# Patient Record
Sex: Female | Born: 1965 | Race: Black or African American | Hispanic: No | Smoking: Never smoker
Health system: Southern US, Community
[De-identification: ages and names within clinical notes are randomized; demographics above are authoritative.]

## PROBLEM LIST (undated history)

## (undated) DIAGNOSIS — J45909 Unspecified asthma, uncomplicated: Secondary | ICD-10-CM

## (undated) DIAGNOSIS — K819 Cholecystitis, unspecified: Secondary | ICD-10-CM

## (undated) DIAGNOSIS — M810 Age-related osteoporosis without current pathological fracture: Secondary | ICD-10-CM

## (undated) DIAGNOSIS — R768 Other specified abnormal immunological findings in serum: Secondary | ICD-10-CM

## (undated) DIAGNOSIS — M069 Rheumatoid arthritis, unspecified: Secondary | ICD-10-CM

## (undated) DIAGNOSIS — M1990 Unspecified osteoarthritis, unspecified site: Secondary | ICD-10-CM

## (undated) DIAGNOSIS — R7689 Other specified abnormal immunological findings in serum: Secondary | ICD-10-CM

## (undated) HISTORY — PX: REPLACEMENT TOTAL KNEE: SUR1224

---

## 1990-12-04 DIAGNOSIS — Z87410 Personal history of cervical dysplasia: Secondary | ICD-10-CM

## 1990-12-04 HISTORY — PX: CERVICAL BIOPSY  W/ LOOP ELECTRODE EXCISION: SUR135

## 1990-12-04 HISTORY — DX: Personal history of cervical dysplasia: Z87.410

## 2016-12-04 HISTORY — PX: TOTAL KNEE ARTHROPLASTY: SHX125

## 2019-01-30 ENCOUNTER — Other Ambulatory Visit: Payer: Self-pay | Admitting: Family Medicine

## 2019-01-30 DIAGNOSIS — Z1231 Encounter for screening mammogram for malignant neoplasm of breast: Secondary | ICD-10-CM

## 2019-02-26 ENCOUNTER — Ambulatory Visit: Payer: Self-pay

## 2019-03-26 ENCOUNTER — Ambulatory Visit: Payer: Self-pay

## 2019-05-27 ENCOUNTER — Ambulatory Visit
Admission: RE | Admit: 2019-05-27 | Discharge: 2019-05-27 | Disposition: A | Discharge status: Home / Self Care | Payer: 59 | Source: Ambulatory Visit | Attending: Family Medicine | Admitting: Family Medicine

## 2019-05-27 ENCOUNTER — Other Ambulatory Visit: Payer: Self-pay

## 2019-05-27 DIAGNOSIS — Z1231 Encounter for screening mammogram for malignant neoplasm of breast: Secondary | ICD-10-CM

## 2019-08-12 ENCOUNTER — Emergency Department (HOSPITAL_COMMUNITY)
Admission: EM | Admit: 2019-08-12 | Discharge: 2019-08-12 | Disposition: A | Discharge status: Home / Self Care | Payer: 59 | Attending: Emergency Medicine | Admitting: Emergency Medicine

## 2019-08-12 ENCOUNTER — Encounter (HOSPITAL_COMMUNITY): Payer: Self-pay | Admitting: Emergency Medicine

## 2019-08-12 DIAGNOSIS — H18892 Other specified disorders of cornea, left eye: Secondary | ICD-10-CM | POA: Insufficient documentation

## 2019-08-12 DIAGNOSIS — H5712 Ocular pain, left eye: Secondary | ICD-10-CM | POA: Diagnosis present

## 2019-08-12 DIAGNOSIS — M069 Rheumatoid arthritis, unspecified: Secondary | ICD-10-CM | POA: Insufficient documentation

## 2019-08-12 HISTORY — DX: Rheumatoid arthritis, unspecified: M06.9

## 2019-08-12 MED ORDER — TETRACAINE HCL 0.5 % OP SOLN
2.0000 [drp] | Freq: Once | OPHTHALMIC | Status: AC
  Administered 2019-08-12: 2 [drp] via OPHTHALMIC
  Filled 2019-08-12: qty 4

## 2019-08-12 MED ORDER — FLUORESCEIN SODIUM 1 MG OP STRP
1.0000 | ORAL_STRIP | Freq: Once | OPHTHALMIC | Status: AC
  Administered 2019-08-12: 1 via OPHTHALMIC
  Filled 2019-08-12: qty 1

## 2019-08-12 NOTE — Discharge Instructions (Signed)
Please make sure to fill your prescription for the eyedrops that you received while at urgent care yesterday.  Please call your ophthalmologist today to schedule an appointment for follow-up.  Return to the emergency department immediately for any vision changes, red eye, persistent eye pain, persistent headache, numbness/weakness, dizziness/lightheadedness, or any new or worsening symptoms.

## 2019-08-12 NOTE — ED Provider Notes (Signed)
[facility] EMERGENCY DEPARTMENT Provider Note   CSN: [id number] Arrival date & time: 08/12/19  1000     History   Chief Complaint Chief Complaint  Patient presents with  . Eye Pain    HPI Donna Mason is a 53 y.o. female.     HPI   Patient is a 53 year old female with history of RA who presents emergency department today for evaluation of left eye irritation.  States that 6 days ago she was getting her into her car when she walked by someone who was working on the line and there was a lot of dust in the air.  States she had some irritation to her eye there and was rubbing her eye however did not have any significant pain.  That night she woke up with left eye pain.  States the pain shot to the back of her head.  Symptoms resolved several hours later after taking Tylenol and some prednisone that she has for her RA.  Since then she has had intermittent pain in the eye described as foreign body sensation.  She denies any visual changes.  She has had some clear tearing present. Has had intermittent photophobia that has since resolved. Denies any floaters/flashes, persistent headaches.  No dizziness, lightheadedness, vomiting, numbness/weakness.  She had had some nasal congestion.  She does not wear contacts.  States she has a prescription for glasses but has not worn them in several years.  She is never had similar symptoms.  States he was seen at urgent care and had fluorescein stain completed which did not show any uptake.  She was given Rx for Polytrim which she states she has been unable to fill yet because the pharmacy was closed.  Past Medical History:  Diagnosis Date  . RA (rheumatoid arthritis) [facility])     Patient Active Problem List   Diagnosis Date Noted  . RA (rheumatoid arthritis) ([place])     History reviewed. No pertinent surgical history.   OB History   No obstetric history on file.      Home Medications    Prior to Admission medications    Not on File    Family History History reviewed. No pertinent family history.  Social History Social History   Tobacco Use  . Smoking status: Never Smoker  . Smokeless tobacco: Never Used  Substance Use Topics  . Alcohol use: Not on file  . Drug use: Not on file     Allergies   Patient has no known allergies.   Review of Systems Review of Systems  Constitutional: Negative for fever.  HENT: Positive for congestion.   Eyes: Positive for photophobia (resolved), pain (intermittent, resolved) and discharge (clear). Negative for redness and visual disturbance.  Respiratory: Negative for shortness of breath.   Cardiovascular: Negative for chest pain.  Gastrointestinal: Negative for abdominal pain.  Genitourinary: Negative for pelvic pain.  Neurological: Positive for headaches (resolved). Negative for dizziness, weakness, light-headedness and numbness.     Physical Exam Updated Vital Signs BP (!) 135/101   Pulse 83   Temp 99.8 F (37.7 C) (Oral)   Resp 18   SpO2 100%   Physical Exam Vitals signs and nursing note reviewed.  Constitutional:      General: She is not in acute distress.    Appearance: She is well-developed.  HENT:     Head: Normocephalic and atraumatic.  Eyes:     General:        Right eye: No discharge.  Left eye: No discharge.     Extraocular Movements: Extraocular movements intact.     Conjunctiva/sclera: Conjunctivae normal.     Pupils: Pupils are equal, round, and reactive to light.     Comments: Fluorescein stain completed and without evidence of uptake.  Tonometry completed, 18 mmHg to both right/left eyes.  No photophobia or consensual photophobia.  Neck:     Musculoskeletal: Normal range of motion and neck supple.  Cardiovascular:     Rate and Rhythm: Normal rate.  Pulmonary:     Effort: Pulmonary effort is normal.  Musculoskeletal: Normal range of motion.     Comments: TTP to the left cervical paraspinous muscles  Skin:     General: Skin is warm and dry.  Neurological:     Mental Status: She is alert.     Comments: Mental Status:  Alert, thought content appropriate, able to give a coherent history. Speech fluent without evidence of aphasia. Able to follow 2 step commands without difficulty.  Cranial Nerves:  II:  pupils equal, round, reactive to light III,IV, VI: ptosis not present, extra-ocular motions intact bilaterally  V,VII: smile symmetric, facial light touch sensation equal VIII: hearing grossly normal to voice  X: uvula elevates symmetrically  XI: bilateral shoulder shrug symmetric and strong XII: midline tongue extension without fassiculations Motor:  Normal tone. 5/5 strength of BUE and BLE major muscle groups including strong and equal grip strength and dorsiflexion/plantar flexion Sensory: light touch normal in all extremities.      ED Treatments / Results  Labs (all labs ordered are listed, but only abnormal results are displayed) Labs Reviewed - No data to display  EKG None  Radiology No results found.  Procedures Procedures (including critical care time)  Medications Ordered in ED Medications  tetracaine (PONTOCAINE) 0.5 % ophthalmic solution 2 drop (2 drops Left Eye Given 08/12/19 1213)  fluorescein ophthalmic strip 1 strip (1 strip Left Eye Given 08/12/19 1213)     Initial Impression / Assessment and Plan / ED Course  I have reviewed the triage vital signs and the nursing notes.  Pertinent labs & imaging results that were available during my care of the patient were reviewed by me and considered in my medical decision making (see chart for details).     Final Clinical Impressions(s) / ED Diagnoses   Final diagnoses:  Corneal irritation of left eye   52 year old female complaining of left eye irritation/clear drainage intermittently for the last 5 days.  On exam, pupils are equal round and reactive to light.  Extraocular motions are intact. Fluorescein stain completed and  without evidence of uptake.  Tonometry completed, 18 mmHg to both right/left eyes.  No photophobia or consensual photophobia.  Visual acuity completed and patient has 20/40 vision in the left eye, 20/50 in the right eye and 20/50 bilaterally without corrective glasses (patient did not bring these and does not wear them).  Her vision is actually better in the left eye which is the eye of concern.  Exam non-concerning for orbital cellulitis, hyphema, corneal ulcers or trauma.  Doubt angle-closure glaucoma, CVA, or other emergent ophthalmologic or neurologic cause of symptoms.  She does have an ophthalmologist.  I recommended she contact her ophthalmologist for an appointment this week.  I also recommended filling her Rx for Polytrim.  I have advised her to return to the ED for any new or worsening symptoms.  She voiced understanding of the plan and reasons to return.  All questions answered.  Patient stable  for discharge.  ED Discharge Orders    None       Karrie MeresCouture, Zavior Thomason S, [place]PA-C 08/12/19 1413    Tegeler, Canary Brimhristopher J, MD 08/12/19 [phone]

## 2019-08-12 NOTE — ED Notes (Signed)
Patient verbalizes understanding of discharge instructions. Opportunity for questioning and answers were provided. Armband removed by staff, pt discharged from ED ambulatory to home.  

## 2019-08-12 NOTE — ED Triage Notes (Signed)
Pt here from home with c/p left eye pain a drainage , pt had her eye looked at was told that it was not pink eye and no tear did not test pressure

## 2019-08-21 ENCOUNTER — Other Ambulatory Visit: Payer: Self-pay | Admitting: Family Medicine

## 2019-08-21 DIAGNOSIS — R42 Dizziness and giddiness: Secondary | ICD-10-CM

## 2019-08-21 DIAGNOSIS — H539 Unspecified visual disturbance: Secondary | ICD-10-CM

## 2019-09-10 ENCOUNTER — Other Ambulatory Visit: Payer: 59

## 2020-02-17 ENCOUNTER — Other Ambulatory Visit: Payer: Self-pay | Admitting: Family Medicine

## 2020-02-17 DIAGNOSIS — Z1231 Encounter for screening mammogram for malignant neoplasm of breast: Secondary | ICD-10-CM

## 2020-03-04 ENCOUNTER — Ambulatory Visit: Payer: 59 | Attending: Internal Medicine

## 2020-03-04 DIAGNOSIS — Z23 Encounter for immunization: Secondary | ICD-10-CM

## 2020-03-04 NOTE — Progress Notes (Signed)
   Covid-19 Vaccination Clinic  Name:  Donna Mason    MRN: [id number] DOB: October 22, 1966  03/04/2020  [name] was observed post Covid-19 immunization for 15 minutes without incident. She was provided with Vaccine Information Sheet and instruction to access the V-Safe system.   [name] was instructed to call 911 with any severe reactions post vaccine: Marland Kitchen Difficulty breathing  . Swelling of face and throat  . A fast heartbeat  . A bad rash all over body  . Dizziness and weakness   Immunizations Administered    Name Date Dose VIS Date Route   Pfizer COVID-19 Vaccine 03/04/2020  3:30 PM 0.3 mL 11/14/2019 Intramuscular   Manufacturer: [employer], [employer]   Lot: [id number]   NDC: [address]-3

## 2020-03-29 ENCOUNTER — Ambulatory Visit: Payer: 59 | Attending: Internal Medicine

## 2020-03-29 DIAGNOSIS — Z23 Encounter for immunization: Secondary | ICD-10-CM

## 2020-03-29 NOTE — Progress Notes (Signed)
   Covid-19 Vaccination Clinic  Name:  Harveen Flesch    MRN: [id number] DOB: 04-12-1966  03/29/2020  Ms. Hamada was observed post Covid-19 immunization for 15 minutes without incident. She was provided with Vaccine Information Sheet and instruction to access the V-Safe system.   Ms. Pua was instructed to call 911 with any severe reactions post vaccine: Marland Kitchen Difficulty breathing  . Swelling of face and throat  . A fast heartbeat  . A bad rash all over body  . Dizziness and weakness   Immunizations Administered    Name Date Dose VIS Date Route   Pfizer COVID-19 Vaccine 03/29/2020 12:08 PM 0.3 mL 01/28/2019 Intramuscular   Manufacturer: [employer], [employer]   Lot: [id number]   NDC: [address]-5

## 2020-05-27 ENCOUNTER — Ambulatory Visit
Admission: RE | Admit: 2020-05-27 | Discharge: 2020-05-27 | Disposition: A | Discharge status: Home / Self Care | Payer: 59 | Source: Ambulatory Visit | Attending: Family Medicine | Admitting: Family Medicine

## 2020-05-27 ENCOUNTER — Other Ambulatory Visit: Payer: Self-pay

## 2020-05-27 DIAGNOSIS — Z1231 Encounter for screening mammogram for malignant neoplasm of breast: Secondary | ICD-10-CM

## 2020-10-16 ENCOUNTER — Ambulatory Visit: Payer: 59

## 2020-12-31 DIAGNOSIS — Z8616 Personal history of COVID-19: Secondary | ICD-10-CM

## 2020-12-31 HISTORY — DX: Personal history of COVID-19: Z86.16

## 2021-04-05 ENCOUNTER — Other Ambulatory Visit: Payer: Self-pay | Admitting: Family Medicine

## 2021-04-05 DIAGNOSIS — Z1231 Encounter for screening mammogram for malignant neoplasm of breast: Secondary | ICD-10-CM

## 2021-05-30 ENCOUNTER — Other Ambulatory Visit: Payer: Self-pay

## 2021-05-30 ENCOUNTER — Ambulatory Visit
Admission: RE | Admit: 2021-05-30 | Discharge: 2021-05-30 | Disposition: A | Discharge status: Home / Self Care | Payer: 59 | Source: Ambulatory Visit | Attending: Family Medicine | Admitting: Family Medicine

## 2021-05-30 DIAGNOSIS — Z1231 Encounter for screening mammogram for malignant neoplasm of breast: Secondary | ICD-10-CM

## 2021-06-01 ENCOUNTER — Other Ambulatory Visit: Payer: Self-pay | Admitting: Family Medicine

## 2021-06-01 DIAGNOSIS — R928 Other abnormal and inconclusive findings on diagnostic imaging of breast: Secondary | ICD-10-CM

## 2021-06-15 ENCOUNTER — Ambulatory Visit
Admission: RE | Admit: 2021-06-15 | Discharge: 2021-06-15 | Disposition: A | Discharge status: Home / Self Care | Payer: 59 | Source: Ambulatory Visit | Attending: Family Medicine | Admitting: Family Medicine

## 2021-06-15 ENCOUNTER — Other Ambulatory Visit: Payer: Self-pay

## 2021-06-15 DIAGNOSIS — R928 Other abnormal and inconclusive findings on diagnostic imaging of breast: Secondary | ICD-10-CM

## 2021-06-19 ENCOUNTER — Emergency Department (HOSPITAL_BASED_OUTPATIENT_CLINIC_OR_DEPARTMENT_OTHER)
Admission: EM | Admit: 2021-06-19 | Discharge: 2021-06-19 | Disposition: A | Discharge status: Home / Self Care | Payer: 59 | Attending: Emergency Medicine | Admitting: Emergency Medicine

## 2021-06-19 ENCOUNTER — Other Ambulatory Visit: Payer: Self-pay

## 2021-06-19 ENCOUNTER — Encounter (HOSPITAL_BASED_OUTPATIENT_CLINIC_OR_DEPARTMENT_OTHER): Payer: Self-pay

## 2021-06-19 DIAGNOSIS — Z96651 Presence of right artificial knee joint: Secondary | ICD-10-CM | POA: Insufficient documentation

## 2021-06-19 DIAGNOSIS — H539 Unspecified visual disturbance: Secondary | ICD-10-CM | POA: Insufficient documentation

## 2021-06-19 MED ORDER — TETRACAINE HCL 0.5 % OP SOLN
2.0000 [drp] | Freq: Once | OPHTHALMIC | Status: AC
  Administered 2021-06-19: 2 [drp] via OPHTHALMIC
  Filled 2021-06-19: qty 4

## 2021-06-19 MED ORDER — FLUORESCEIN SODIUM 1 MG OP STRP
ORAL_STRIP | OPHTHALMIC | Status: AC
  Administered 2021-06-19: 1
  Filled 2021-06-19: qty 1

## 2021-06-19 NOTE — Discharge Instructions (Addendum)
You were seen in the emergency department for left eye visual disturbance.  We reviewed her case with Dr. Dione Booze.  Please call him at [phone] to arrange follow-up with him today.

## 2021-06-19 NOTE — ED Notes (Signed)
Pt dc home with understanding of dc instructions and followup care.

## 2021-06-19 NOTE — ED Triage Notes (Signed)
Sneezing and coughing for three days "allergies"  Onset last night of seeing a "circle" outline in left eye.  Denies pain but feels like have something in eye.

## 2021-06-19 NOTE — ED Provider Notes (Signed)
[facility] EMERGENCY DEPT Provider Note   CSN: [id number] Arrival date & time: 06/19/21  1046     History Chief Complaint  Patient presents with  . Eye Problem    Seeing circle in vision    Donna Mason is a 55 y.o. female.  She said she had a foreign body sensation in her left eye since yesterday.  Since then she has noticed a circular defect that seems to be moving in and out of her visual field.  She said with the lights off she can see a pulsing.  She does not feel her vision is diminished.  Supposed to wear glasses but does not because it gives her headaches.  Few days of some allergy symptoms.  The history is provided by the patient.  Eye Problem Location:  Left eye Quality:  Foreign body sensation Onset quality:  Sudden Duration:  2 days Timing:  Intermittent Progression:  Unchanged Chronicity:  New Relieved by:  Nothing Worsened by:  Nothing Ineffective treatments:  Eye drops Associated symptoms: no blurred vision, no decreased vision, no double vision, no headaches and no photophobia       Past Medical History:  Diagnosis Date  . RA (rheumatoid arthritis) [facility])     Patient Active Problem List   Diagnosis Date Noted  . RA (rheumatoid arthritis) ([facility])     Past Surgical History:  Procedure Laterality Date  . REPLACEMENT TOTAL KNEE Right      OB History   No obstetric history on file.     Family History  Problem Relation Age of Onset  . Breast cancer Neg Hx     Social History   Tobacco Use  . Smoking status: Never  . Smokeless tobacco: Never  Vaping Use  . Vaping Use: Never used  Substance Use Topics  . Alcohol use: Not Currently  . Drug use: Not Currently    Home Medications Prior to Admission medications   Not on File    Allergies    Patient has no known allergies.  Review of Systems   Review of Systems  Constitutional:  Negative for fever.  HENT:  Negative for sore throat.   Eyes:  Positive for visual  disturbance. Negative for blurred vision, double vision and photophobia.  Respiratory:  Negative for shortness of breath.   Cardiovascular:  Negative for chest pain.  Gastrointestinal:  Negative for abdominal pain.  Genitourinary:  Negative for dysuria.  Skin:  Negative for rash.  Neurological:  Negative for headaches.   Physical Exam Updated Vital Signs BP (!) 148/94 (BP Location: Right Arm)   Pulse 82   Temp 98.7 F (37.1 C) (Oral)   Resp 16   Ht 5\' 3"  (1.6 m)   Wt 82.6 kg   SpO2 100%   BMI 32.24 kg/m   Physical Exam Constitutional:      Appearance: Normal appearance. She is well-developed.  HENT:     Head: Normocephalic and atraumatic.  Eyes:     General: Lids are everted, no foreign bodies appreciated. Vision grossly intact. Gaze aligned appropriately.        Right eye: No foreign body.        Left eye: No foreign body.     Intraocular pressure: Right eye pressure is 18 mmHg. Left eye pressure is 18 mmHg. Measurements were taken using a handheld tonometer.    Extraocular Movements: Extraocular movements intact.     Conjunctiva/sclera: Conjunctivae normal.     Right eye: Right conjunctiva is  not injected.     Left eye: Left conjunctiva is not injected.     Pupils: Pupils are equal, round, and reactive to light.     Left eye: No fluorescein uptake.     Funduscopic exam:    Right eye: No exudate.        Left eye: No exudate.     Slit lamp exam:    Right eye: Anterior chamber quiet.     Left eye: Anterior chamber quiet.  Musculoskeletal:     Cervical back: Neck supple.  Skin:    General: Skin is warm and dry.  Neurological:     General: No focal deficit present.     Mental Status: She is alert and oriented to person, place, and time.     GCS: GCS eye subscore is 4. GCS verbal subscore is 5. GCS motor subscore is 6.     Cranial Nerves: No cranial nerve deficit.     Sensory: No sensory deficit.     Motor: No weakness.    ED Results / Procedures / Treatments    Labs (all labs ordered are listed, but only abnormal results are displayed) Labs Reviewed - No data to display  EKG None  Radiology No results found.  Procedures Procedures   Medications Ordered in ED Medications  fluorescein 1 MG ophthalmic strip (has no administration in time range)  tetracaine (PONTOCAINE) 0.5 % ophthalmic solution 2 drop (has no administration in time range)    ED Course  I have reviewed the triage vital signs and the nursing notes.  Pertinent labs & imaging results that were available during my care of the patient were reviewed by me and considered in my medical decision making (see chart for details).  Clinical Course as of 06/19/21 1713  Sun Jun 19, 2021  1116 Discussed with Dr. Dione Booze, Ophtho.  He is recommending that she be seen either today or tomorrow for more complete exam. [MB]    Clinical Course User Index [MB] Terrilee Files, MD   MDM Rules/Calculators/A&P                         55 year old female with 2 days of left eye vision symptoms.  Unclear if she is describing floaters or flashes.  No clear abnormalities on my exam.  Reviewed case with ophthalmology who recommends her being seen in his office today.  Patient in agreement with plan.  Final Clinical Impression(s) / ED Diagnoses Final diagnoses:  Visual disturbance    Rx / DC Orders ED Discharge Orders     None        Terrilee Files, MD 06/19/21 [phone]

## 2021-07-08 ENCOUNTER — Other Ambulatory Visit: Payer: Self-pay

## 2021-07-08 ENCOUNTER — Emergency Department (HOSPITAL_BASED_OUTPATIENT_CLINIC_OR_DEPARTMENT_OTHER)
Admission: EM | Admit: 2021-07-08 | Discharge: 2021-07-08 | Disposition: A | Discharge status: Home / Self Care | Payer: 59 | Attending: Emergency Medicine | Admitting: Emergency Medicine

## 2021-07-08 ENCOUNTER — Emergency Department (HOSPITAL_BASED_OUTPATIENT_CLINIC_OR_DEPARTMENT_OTHER): Payer: 59

## 2021-07-08 ENCOUNTER — Encounter (HOSPITAL_BASED_OUTPATIENT_CLINIC_OR_DEPARTMENT_OTHER): Payer: Self-pay | Admitting: *Deleted

## 2021-07-08 ENCOUNTER — Emergency Department (HOSPITAL_BASED_OUTPATIENT_CLINIC_OR_DEPARTMENT_OTHER): Payer: 59 | Admitting: Radiology

## 2021-07-08 DIAGNOSIS — M546 Pain in thoracic spine: Secondary | ICD-10-CM | POA: Insufficient documentation

## 2021-07-08 DIAGNOSIS — K8020 Calculus of gallbladder without cholecystitis without obstruction: Secondary | ICD-10-CM

## 2021-07-08 DIAGNOSIS — J45909 Unspecified asthma, uncomplicated: Secondary | ICD-10-CM | POA: Insufficient documentation

## 2021-07-08 DIAGNOSIS — R1011 Right upper quadrant pain: Secondary | ICD-10-CM | POA: Diagnosis not present

## 2021-07-08 DIAGNOSIS — Z96651 Presence of right artificial knee joint: Secondary | ICD-10-CM | POA: Diagnosis not present

## 2021-07-08 HISTORY — DX: Unspecified asthma, uncomplicated: J45.909

## 2021-07-08 LAB — URINALYSIS, ROUTINE W REFLEX MICROSCOPIC
Bilirubin Urine: NEGATIVE
Glucose, UA: NEGATIVE mg/dL
Hgb urine dipstick: NEGATIVE
Ketones, ur: NEGATIVE mg/dL
Leukocytes,Ua: NEGATIVE
Nitrite: NEGATIVE
Protein, ur: NEGATIVE mg/dL
Specific Gravity, Urine: 1.011 (ref 1.005–1.030)
pH: 6.5 (ref 5.0–8.0)

## 2021-07-08 LAB — CBC WITH DIFFERENTIAL/PLATELET
Abs Immature Granulocytes: 0.02 10*3/uL (ref 0.00–0.07)
Basophils Absolute: 0 10*3/uL (ref 0.0–0.1)
Basophils Relative: 0 %
Eosinophils Absolute: 0.1 10*3/uL (ref 0.0–0.5)
Eosinophils Relative: 1 %
HCT: 34.6 % — ABNORMAL LOW (ref 36.0–46.0)
Hemoglobin: 11.1 g/dL — ABNORMAL LOW (ref 12.0–15.0)
Immature Granulocytes: 0 %
Lymphocytes Relative: 37 %
Lymphs Abs: 2.2 10*3/uL (ref 0.7–4.0)
MCH: 27.3 pg (ref 26.0–34.0)
MCHC: 32.1 g/dL (ref 30.0–36.0)
MCV: 85 fL (ref 80.0–100.0)
Monocytes Absolute: 0.5 10*3/uL (ref 0.1–1.0)
Monocytes Relative: 8 %
Neutro Abs: 3.2 10*3/uL (ref 1.7–7.7)
Neutrophils Relative %: 54 %
Platelets: 192 10*3/uL (ref 150–400)
RBC: 4.07 MIL/uL (ref 3.87–5.11)
RDW: 14.7 % (ref 11.5–15.5)
WBC: 5.9 10*3/uL (ref 4.0–10.5)
nRBC: 0 % (ref 0.0–0.2)

## 2021-07-08 LAB — COMPREHENSIVE METABOLIC PANEL
ALT: 13 U/L (ref 0–44)
AST: 14 U/L — ABNORMAL LOW (ref 15–41)
Albumin: 3.5 g/dL (ref 3.5–5.0)
Alkaline Phosphatase: 76 U/L (ref 38–126)
Anion gap: 8 (ref 5–15)
BUN: 8 mg/dL (ref 6–20)
CO2: 28 mmol/L (ref 22–32)
Calcium: 8.4 mg/dL — ABNORMAL LOW (ref 8.9–10.3)
Chloride: 104 mmol/L (ref 98–111)
Creatinine, Ser: 0.68 mg/dL (ref 0.44–1.00)
GFR, Estimated: >60 mL/min (ref >60)
Glucose, Bld: 80 mg/dL (ref 70–99)
Potassium: 4.1 mmol/L (ref 3.5–5.1)
Sodium: 140 mmol/L (ref 135–145)
Total Bilirubin: 0.8 mg/dL (ref 0.3–1.2)
Total Protein: 6.4 g/dL — ABNORMAL LOW (ref 6.5–8.1)

## 2021-07-08 LAB — PREGNANCY, URINE: Preg Test, Ur: NEGATIVE

## 2021-07-08 MED ORDER — HYDROCODONE-ACETAMINOPHEN 5-325 MG PO TABS: 1.0000 | ORAL_TABLET | Freq: Four times a day (QID) | ORAL | 0 refills | Status: DC | PRN

## 2021-07-08 NOTE — ED Provider Notes (Signed)
Patient's labs are within normal limits, urine is within normal limits.  Chest x-ray within normal limits.  Ultrasound shows approximately 6 mm polyp versus adherent stone at the gallbladder fundus.  Patient reports that the pain seems to be worse after eating and is a dull ache but then will get sharp pains all in the right upper quadrant.  Suspect this is most likely cholelithiasis.  No evidence of cholecystitis at this time.  Findings discussed with the patient.  She was given prescription for pain medication to use as needed and follow-up with general surgery.   Gwyneth Sprout, MD 07/08/21 [phone]

## 2021-07-08 NOTE — ED Provider Notes (Signed)
[facility] EMERGENCY DEPT Provider Note   CSN: [id number] Arrival date & time: 07/08/21  1048     History No chief complaint on file.   Donna Mason is a 55 y.o. female.  HPI  55 year old female with past medical history of RA presents the emergency department with right upper back pain that wraps around to her right upper quadrant.  She states it feels like she has something swollen up underneath her rib cage.  She is never had symptoms like this before.  Denies any fever, nausea/vomiting/diarrhea, shortness of breath, leg swelling, cough.  She is had no prior surgeries in the abdomen.  Denies any genitourinary symptoms.  Past Medical History:  Diagnosis Date   Asthma    RA (rheumatoid arthritis) ([facility])     Patient Active Problem List   Diagnosis Date Noted   RA (rheumatoid arthritis) ([facility])     Past Surgical History:  Procedure Laterality Date   REPLACEMENT TOTAL KNEE Right      OB History   No obstetric history on file.     Family History  Problem Relation Age of Onset   Breast cancer Neg Hx     Social History   Tobacco Use   Smoking status: Never   Smokeless tobacco: Never  Vaping Use   Vaping Use: Never used  Substance Use Topics   Alcohol use: Not Currently   Drug use: Not Currently    Home Medications Prior to Admission medications   Not on File    Allergies    Patient has no known allergies.  Review of Systems   Review of Systems  Constitutional:  Negative for chills and fever.  HENT:  Negative for congestion.   Eyes:  Negative for visual disturbance.  Respiratory:  Negative for shortness of breath.   Cardiovascular:  Negative for chest pain.  Gastrointestinal:  Positive for abdominal pain. Negative for diarrhea and vomiting.  Genitourinary:  Negative for difficulty urinating, dysuria and flank pain.  Musculoskeletal:  Positive for back pain.  Skin:  Negative for rash.  Neurological:  Negative for headaches.    Physical Exam Updated Vital Signs BP 120/64   Pulse (!) 52   Temp 98.5 F (36.9 C) (Oral)   Resp 17   Ht 5\' 3"  (1.6 m)   Wt 90.3 kg   SpO2 99%   BMI 35.25 kg/m   Physical Exam  ED Results / Procedures / Treatments   Labs (all labs ordered are listed, but only abnormal results are displayed) Labs Reviewed  CBC WITH DIFFERENTIAL/PLATELET  COMPREHENSIVE METABOLIC PANEL  URINALYSIS, ROUTINE W REFLEX MICROSCOPIC  PREGNANCY, URINE    EKG None  Radiology No results found.  Procedures Procedures   Medications Ordered in ED Medications - No data to display  ED Course  I have reviewed the triage vital signs and the nursing notes.  Pertinent labs & imaging results that were available during my care of the patient were reviewed by me and considered in my medical decision making (see chart for details).    MDM Rules/Calculators/A&P                           55 year old female presents the emergency department with right upper back pain that wraps around to her right upper quadrant.  Abdomen is soft and benign.  Vitals are normal and stable.  Concern for possible gallbladder/lung pathology.  No associated shortness of breath, leg swelling.  Lower  suspicion for PE at this time given her presentation and normal vitals, she is Wells 0 in terms of risk.  Plan for metabolic work-up, ultrasound of the right upper quadrant and chest x-ray.   Final Clinical Impression(s) / ED Diagnoses Final diagnoses:  None    Rx / DC Orders ED Discharge Orders     None        Rozelle Logan, DO 07/08/21 1435

## 2021-07-08 NOTE — ED Triage Notes (Addendum)
Tuesday developed mid back pain radiates to rt under breast. Denies N/V/D. Standing seems to help a little. States she wheezes when she lies down.

## 2021-07-08 NOTE — Discharge Instructions (Addendum)
Ultrasound showed that you most likely have a gallstone today.  All your labs otherwise look normal.  Try to avoid fatty, fried or oily foods.  Also raw vegetables can sometimes make the pain worse.  If Tylenol works that is fine but you were given a prescription for stronger pain medicine if you need.  If the pain becomes severe you start vomiting or run a fever you should return to the emergency room.

## 2021-10-25 IMAGING — MG MM DIGITAL SCREENING BILAT W/ TOMO AND CAD
6 of 10 series · 6 of 30 positions shown · non-contrast
Comparison: Previous exam(s).

CLINICAL DATA: Screening.

EXAM:
DIGITAL SCREENING BILATERAL MAMMOGRAM WITH TOMOSYNTHESIS AND CAD
TECHNIQUE: Bilateral screening digital craniocaudal and mediolateral oblique
mammograms were obtained. Bilateral screening digital breast
tomosynthesis was performed. The images were evaluated with
computer-aided detection.

[L CC synth-2D (1 of 2)]
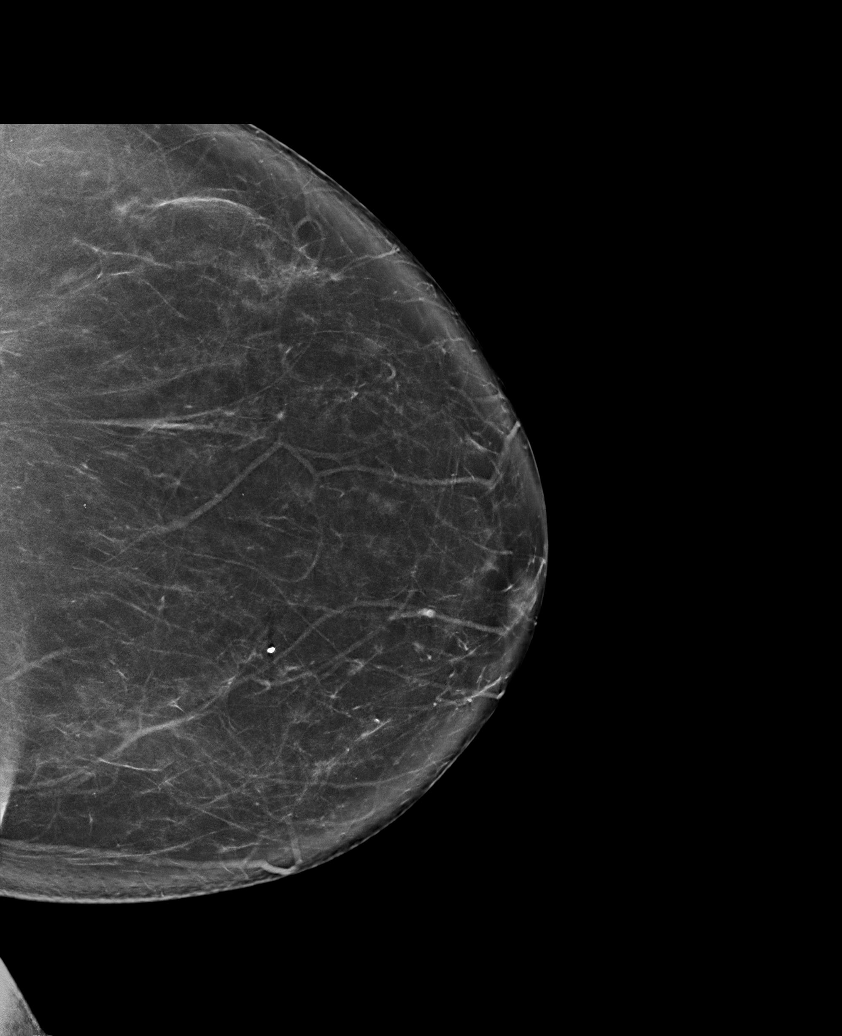

[L CC synth-2D (2 of 2)]
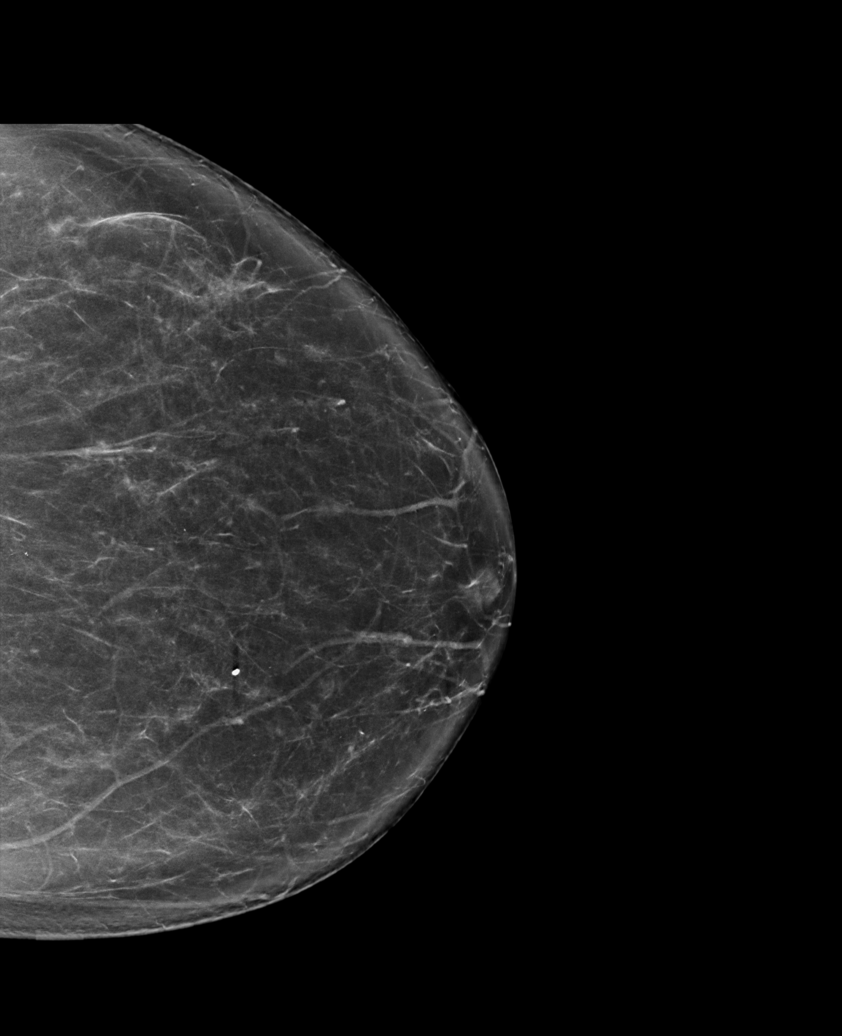

[R CC synth-2D]
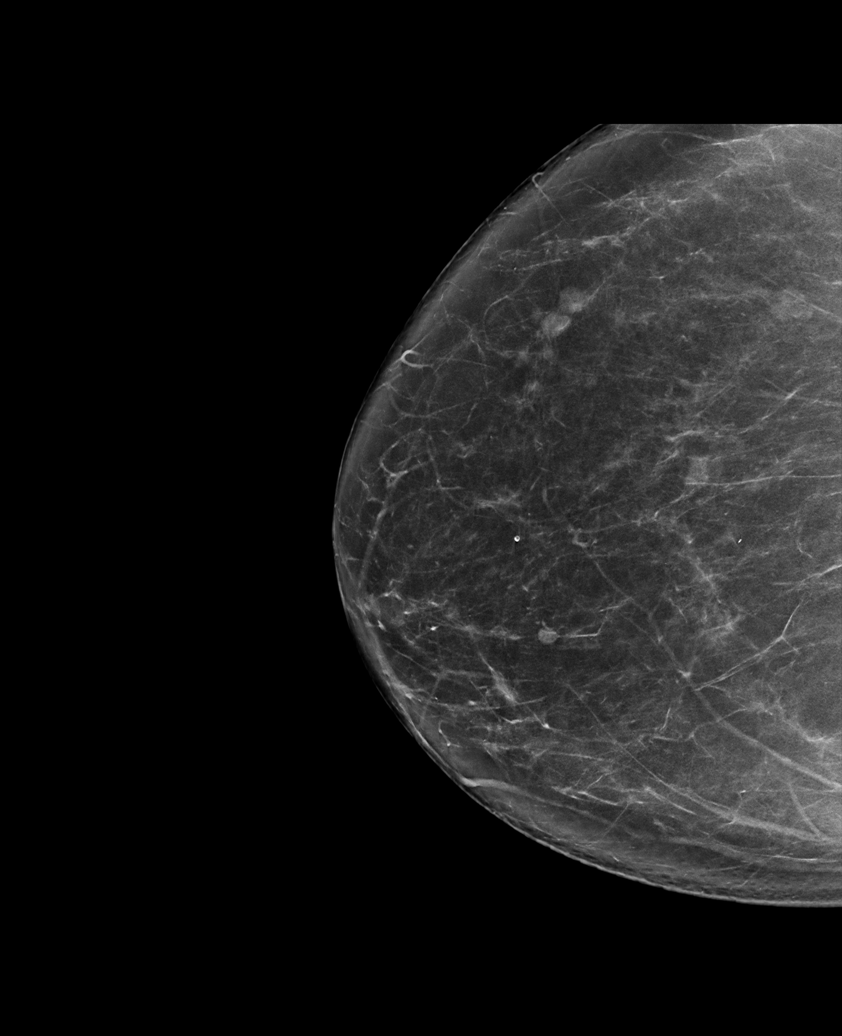

[L MLO synth-2D]
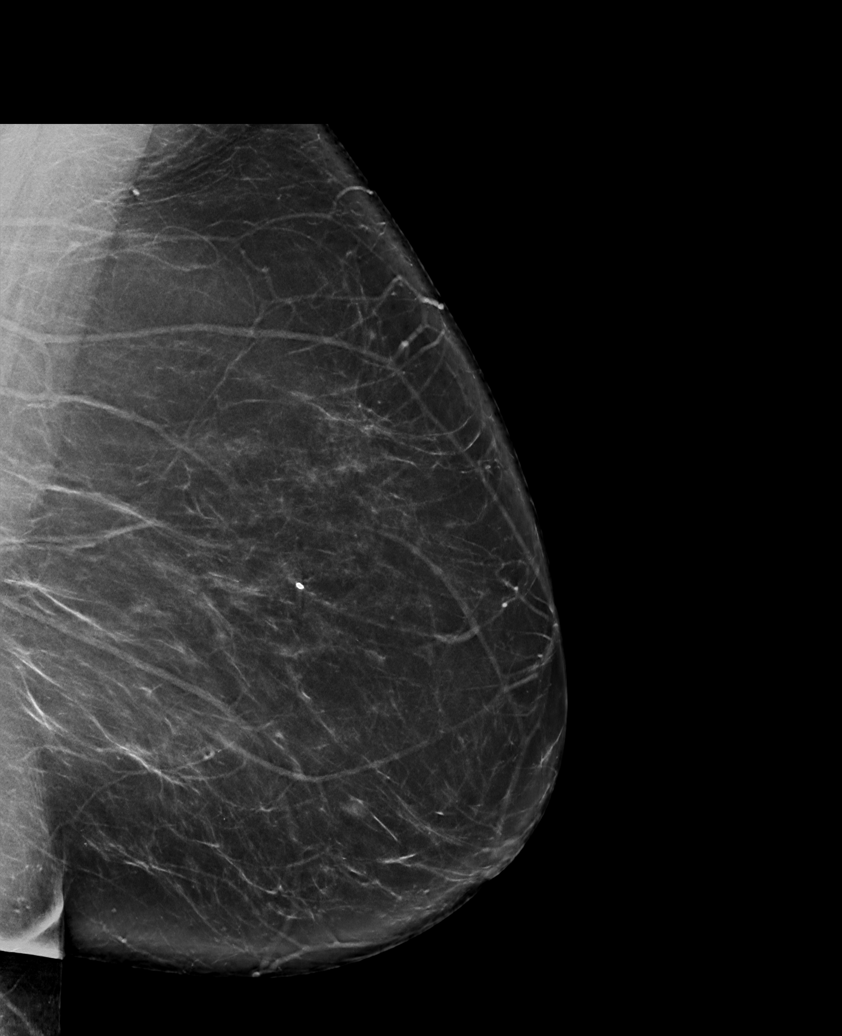

[R MLO synth-2D]
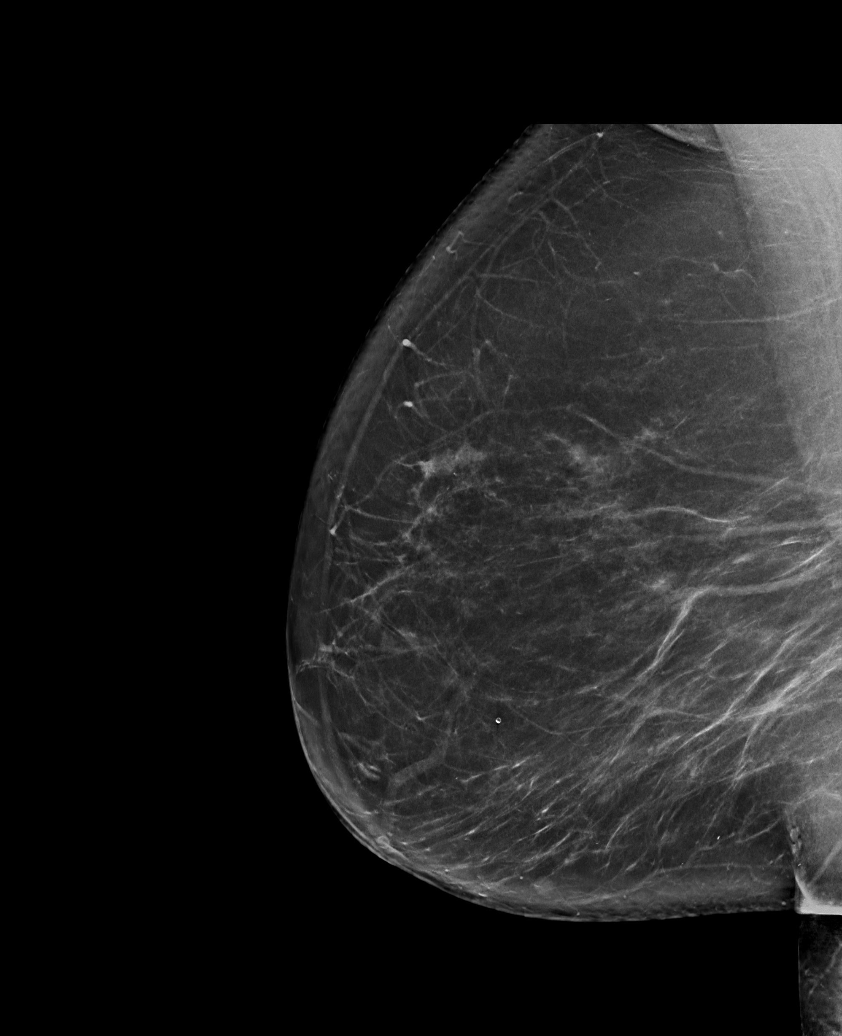

[L CC tomo · tomo slice 44/87.0]
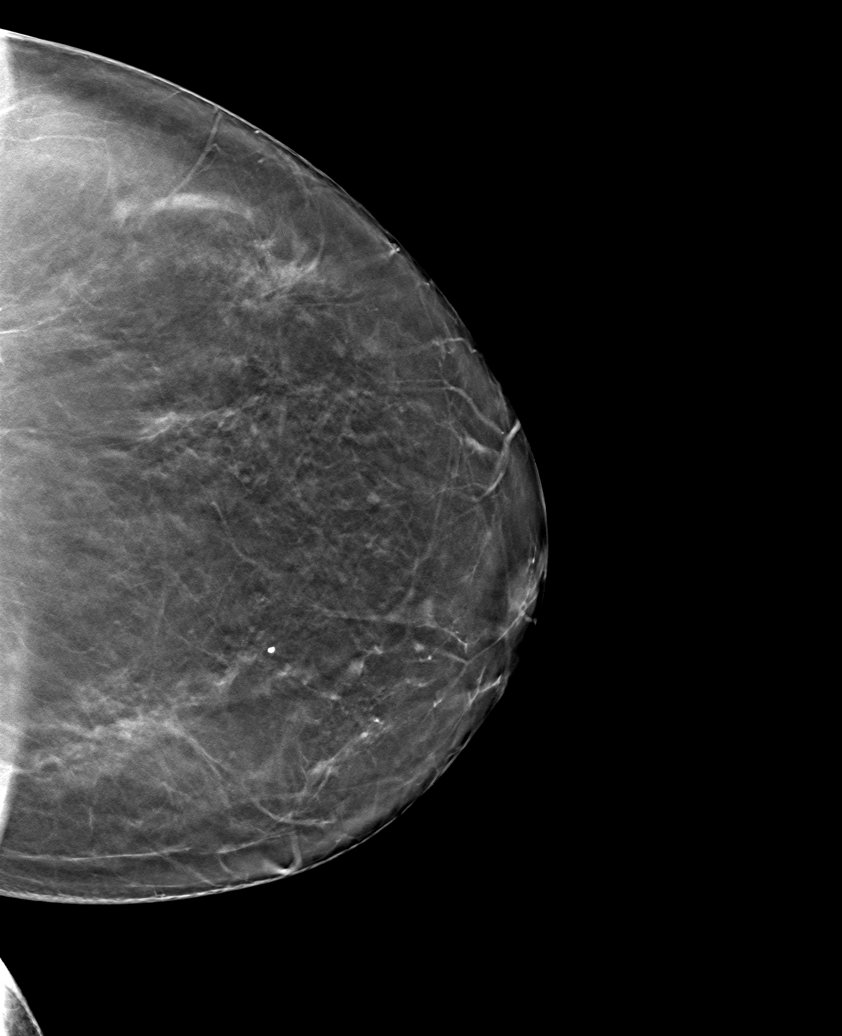

[6 of 30 positions shown; findings below may reference images not displayed]

ACR Breast Density Category b: There are scattered areas of
fibroglandular density.
FINDINGS: In the right breast, a possible mass warrants further evaluation. In
the left breast, no findings suspicious for malignancy.
IMPRESSION: Further evaluation is suggested for possible mass in the right
breast.

RECOMMENDATION:
Ultrasound of the right breast. (Code:HZ-5-[facility])

BI-RADS CATEGORY  0: Incomplete. Need additional imaging evaluation
and/or prior mammograms for comparison.

## 2021-12-03 IMAGING — US US ABDOMEN LIMITED
1 series · 14 of 25 positions shown · non-contrast
Comparison: None.

CLINICAL DATA: RUQ abdominal pain 0AT.AA (RPW-IQ-CM)

EXAM:
ULTRASOUND ABDOMEN LIMITED RIGHT UPPER QUADRANT

[Series 1: us abdomen limited ruq (liver/gb) · 14 of 89 slices shown]
[im 1/89]
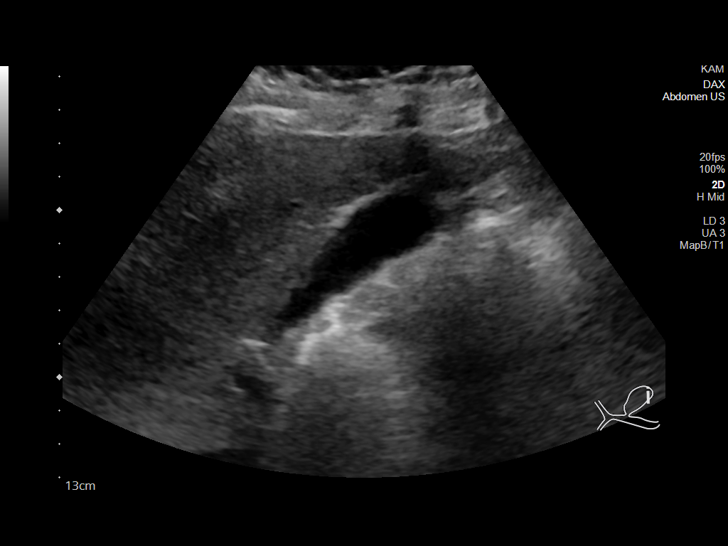
[im 8/89]
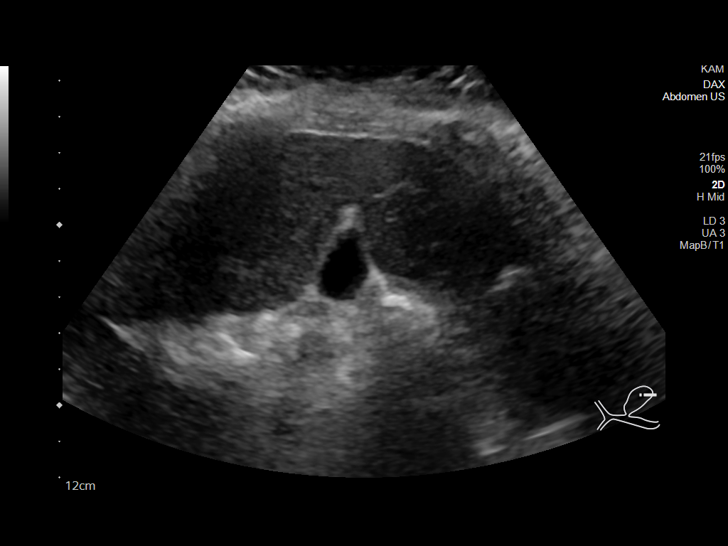
[im 15/89]
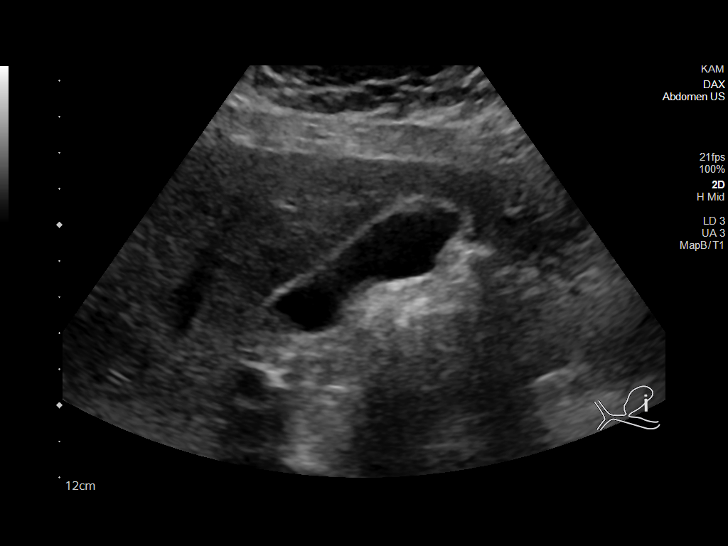
[im 23/89]
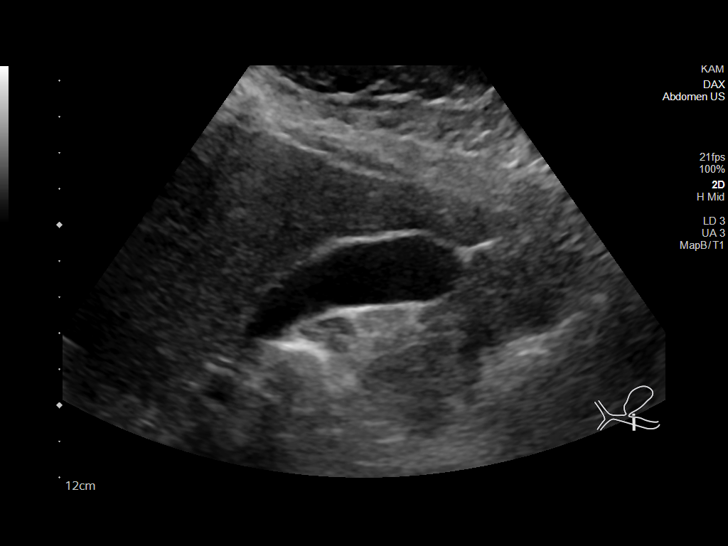
[im 30/89]
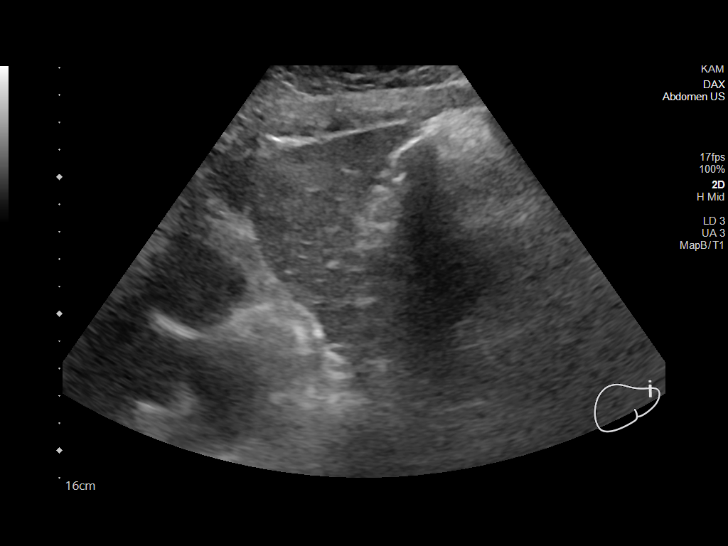
[im 34/89]
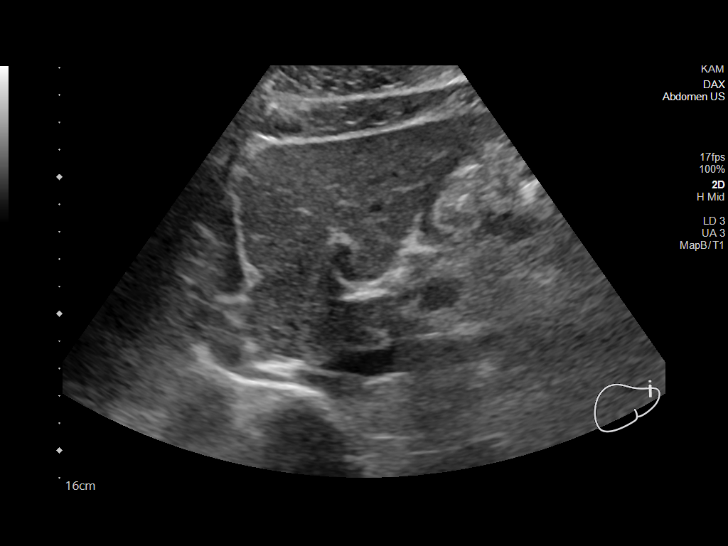
[im 41/89]
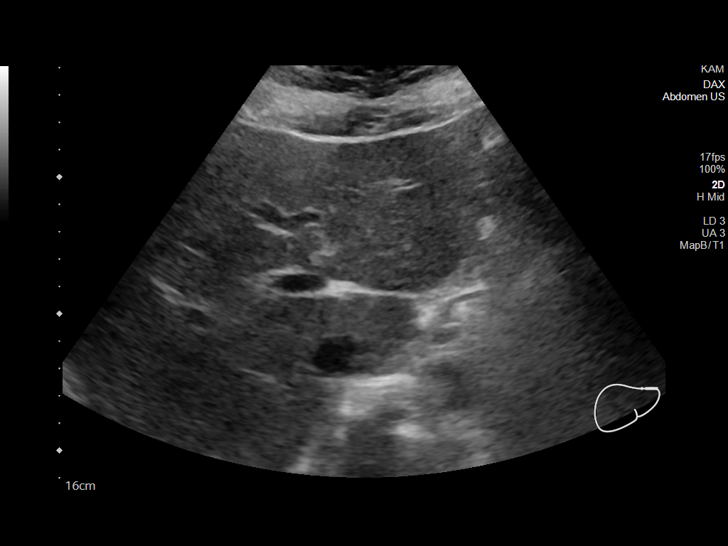
[im 48/89]
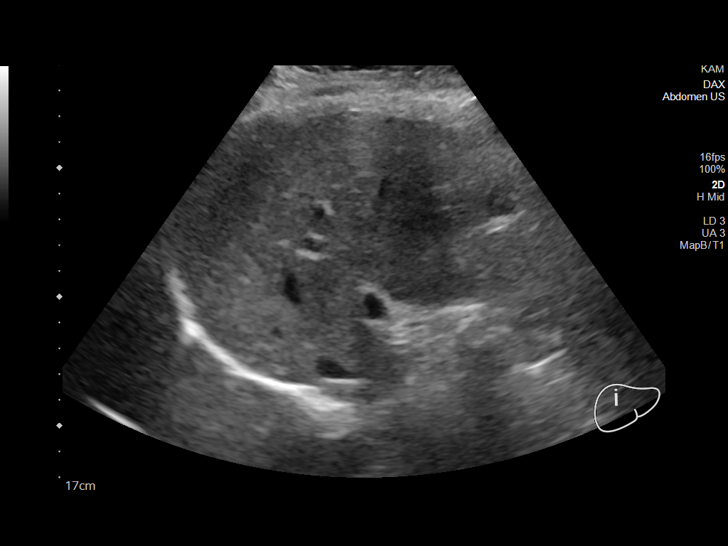
[im 56/89]
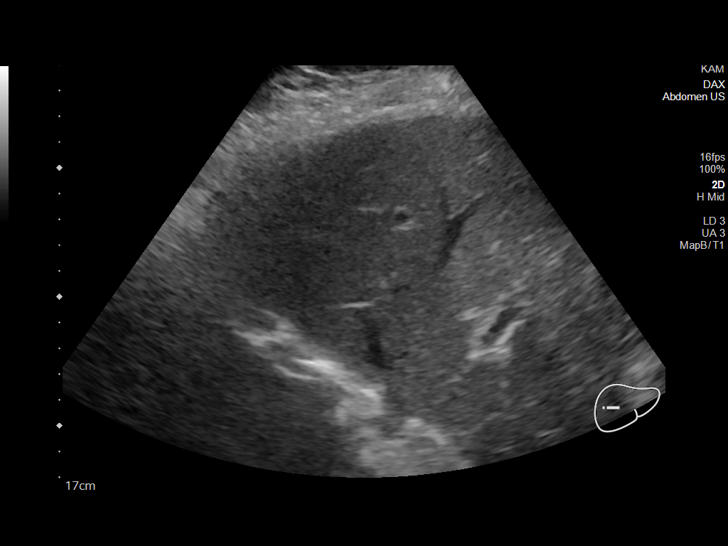
[im 59/89]
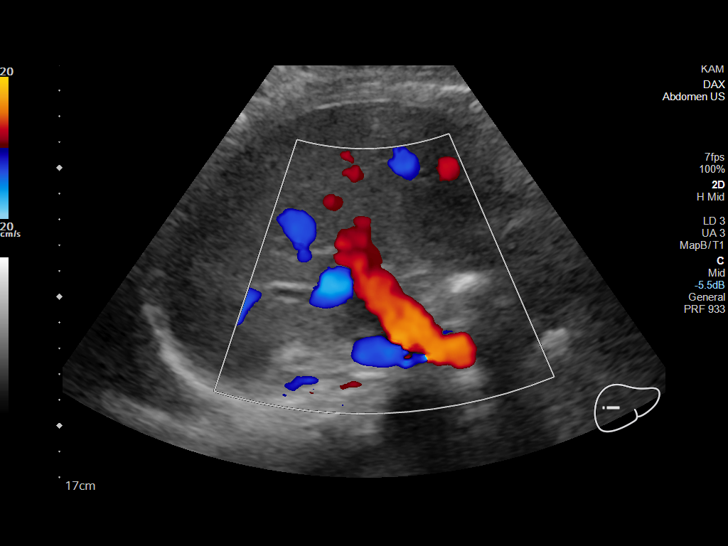
[im 67/89]
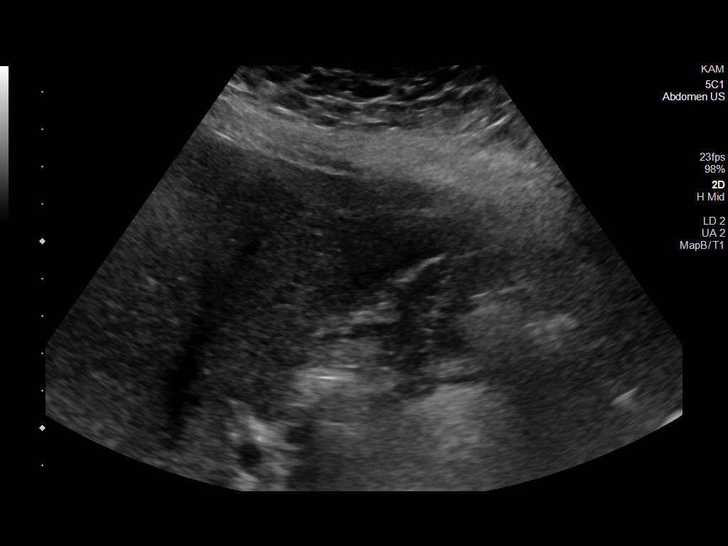
[im 74/89]
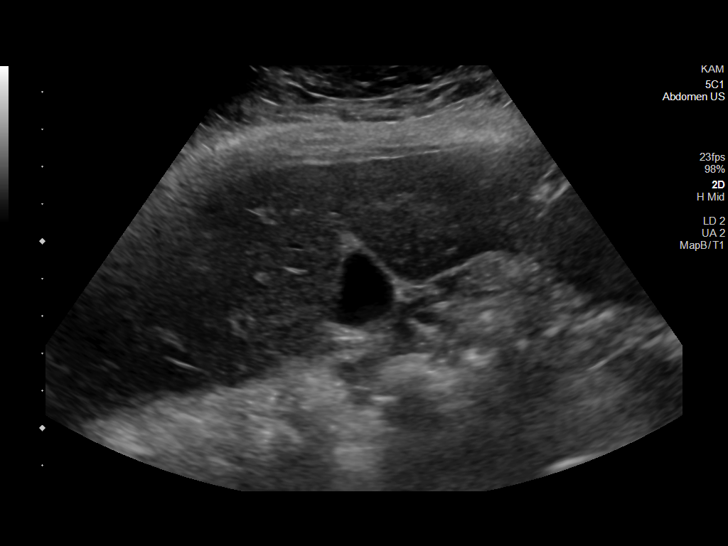
[im 81/89]
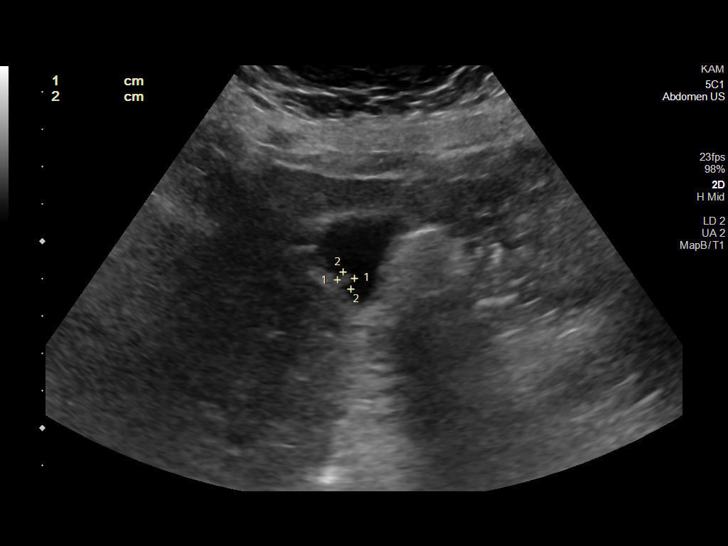
[im 89/89]
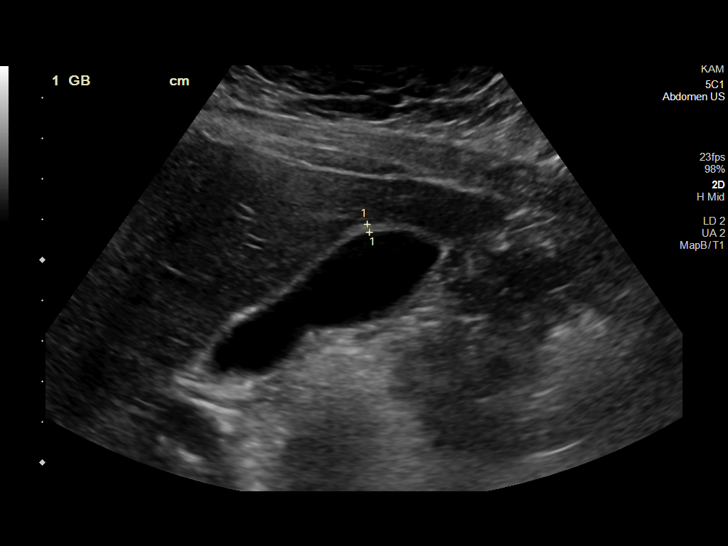

[14 of 25 positions shown; findings below may reference images not displayed]

FINDINGS: Evaluation limited due to patient gas.

Gallbladder:

No gallbladder wall thickening. Approximately 6 x 5 x 5 mm rounded
mildly echogenic lesion at the gallbladder fundus which is immobile
and inseparable from the gallbladder wall. No appreciable flow on
color Doppler imaging. No sonographic Murphy sign noted by
sonographer. No pericholecystic fluid. Nondistended.

Common bile duct:

Diameter: Limited visualization. Visualized portion measures 4.9 mm,
within normal limits.

Liver:

No focal lesion identified. Within normal limits in parenchymal
echogenicity, similar in echogenicity to the adjacent reference
organ the kidney. Portal vein is patent on color Doppler imaging
with normal direction of blood flow towards the liver.

Other: None.
IMPRESSION: 1. Limited study without evidence of acute cholecystitis.
2. Approximately 6 mm polyp versus adherent stone at the gallbladder
fundus.

## 2021-12-03 IMAGING — DX DG CHEST 2V
2 series · 2 of 2 positions shown · non-contrast
Comparison: None.

CLINICAL DATA: Flank pain

EXAM:
CHEST - 2 VIEW

[chest pa]
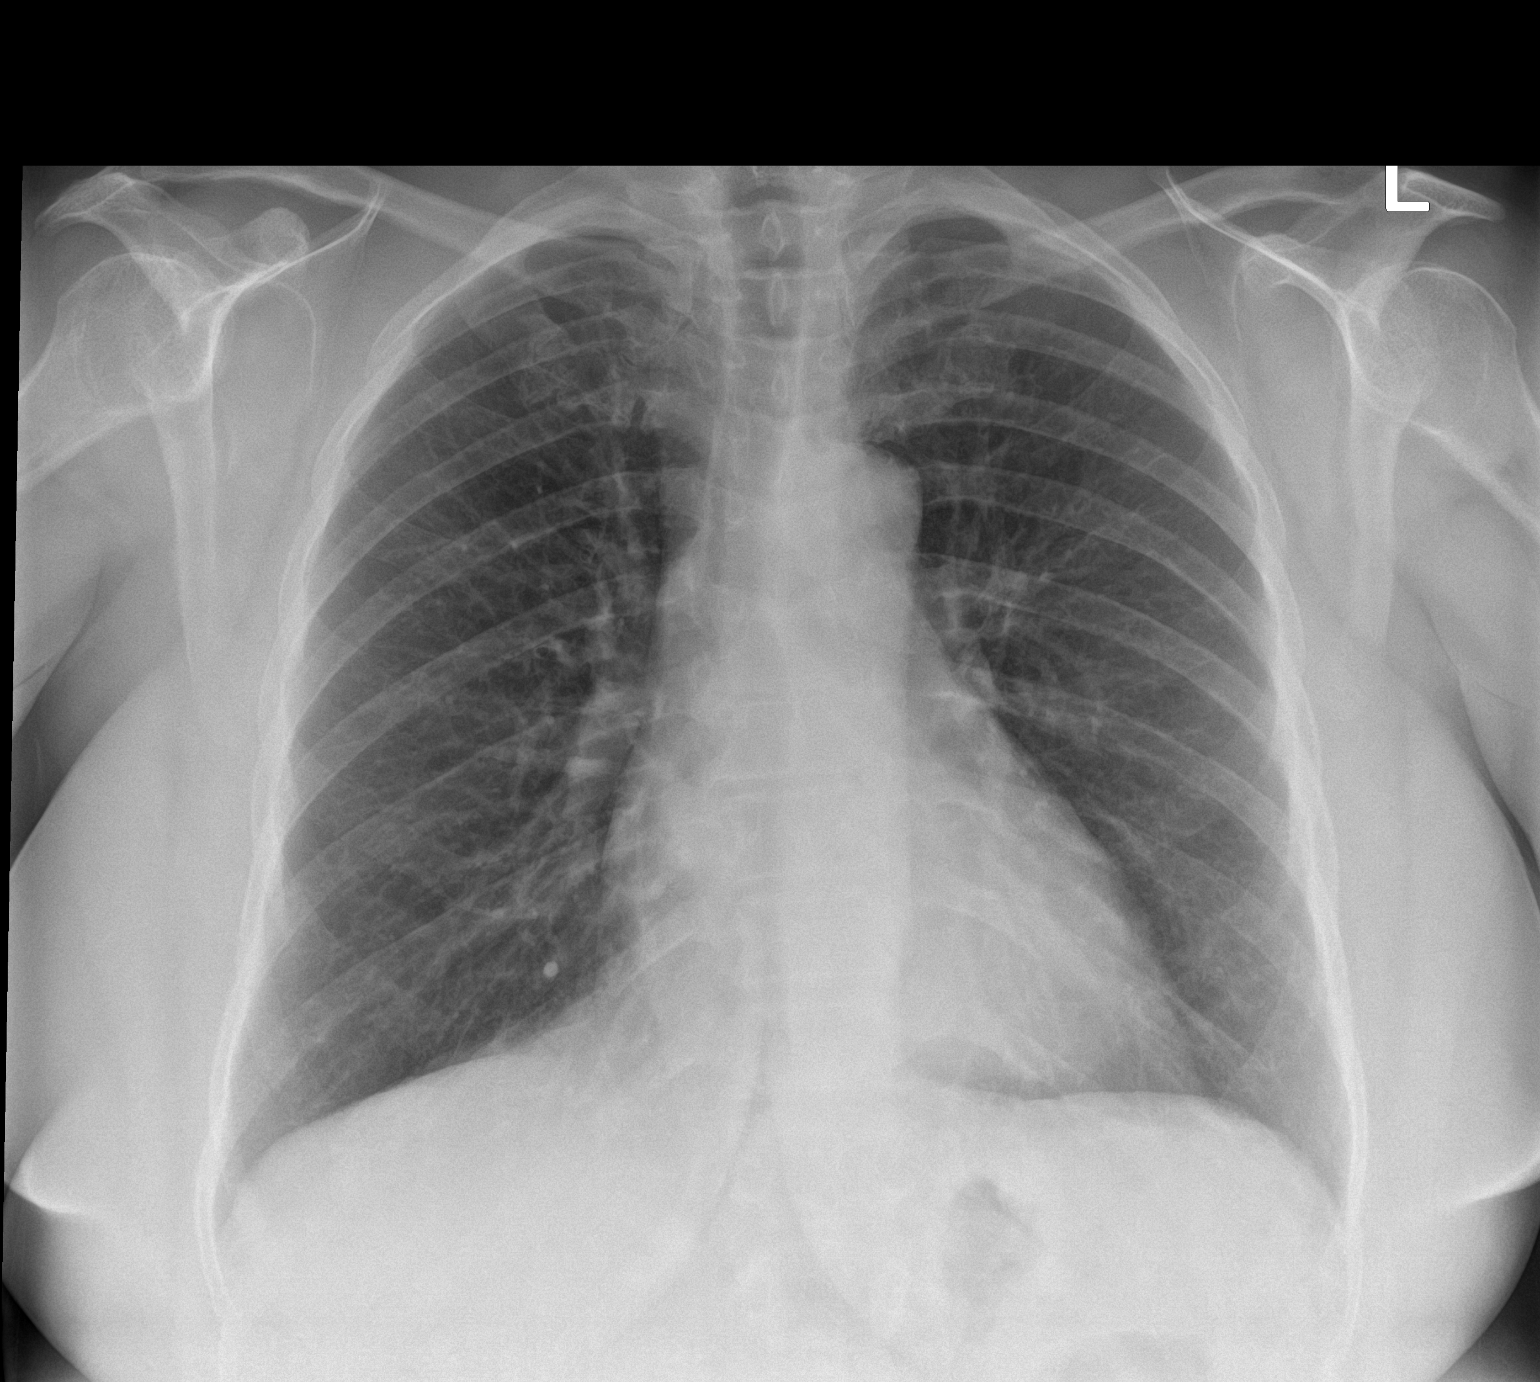

[chest lat]
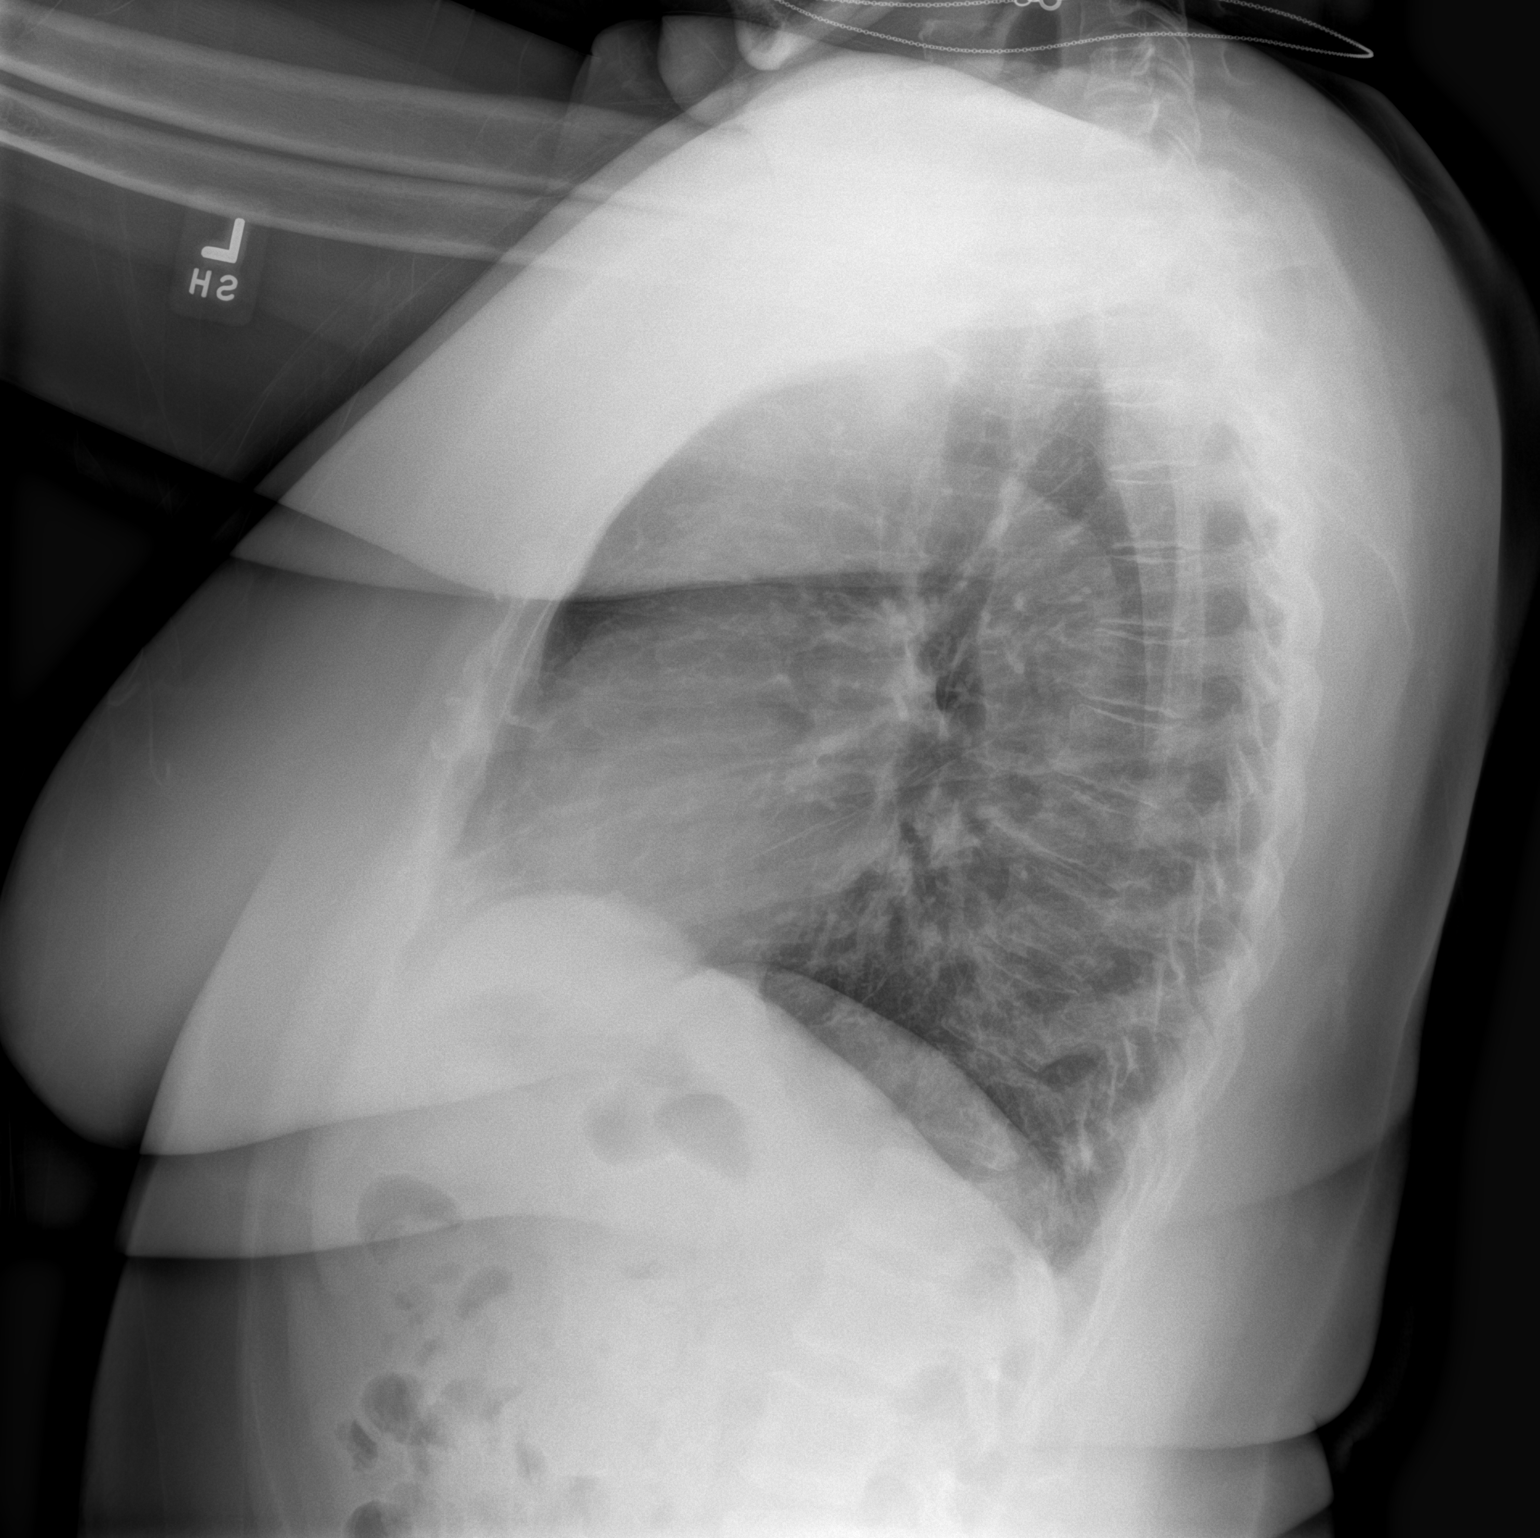

[2 of 2 positions shown; findings below may reference images not displayed]

FINDINGS: The heart size and mediastinal contours are within normal limits.No
focal airspace disease. No pleural effusion or pneumothorax.No acute
osseous abnormality.
IMPRESSION: No evidence of acute cardiopulmonary disease.

## 2021-12-06 ENCOUNTER — Ambulatory Visit: Payer: Self-pay | Admitting: Surgery

## 2021-12-29 ENCOUNTER — Encounter (HOSPITAL_BASED_OUTPATIENT_CLINIC_OR_DEPARTMENT_OTHER): Payer: Self-pay | Admitting: Surgery

## 2021-12-29 ENCOUNTER — Other Ambulatory Visit: Payer: Self-pay

## 2021-12-29 NOTE — Progress Notes (Addendum)
Spoke w/ via phone for pre-op interview--- pt Lab needs dos---- no              Lab results------ no COVID test -----patient states asymptomatic no test needed Arrive at ------- 0630 on 01-04-2022 NPO after MN NO Solid Food.  Clear liquids from MN until--- 0530 Med rec completed Medications to take morning of surgery -----none Diabetic medication ----- n/a Patient instructed no nail polish to be worn day of surgery Patient instructed to bring photo id and insurance card day of surgery Patient aware to have Driver (ride ) / caregiver for 24 hours after surgery -- sister, Donna Mason Patient Special Instructions ----- asked to bring rescue inhaler dos Pre-Op special Istructions ----- n/a Patient verbalized understanding of instructions that were given at this phone interview. Patient denies shortness of breath, chest pain, fever, cough at this phone interview.

## 2022-01-03 NOTE — Anesthesia Preprocedure Evaluation (Addendum)
Anesthesia Evaluation  Patient identified by MRN, date of birth, ID band Patient awake    Reviewed: Allergy & Precautions, NPO status , Patient's Chart, lab work & pertinent test results  History of Anesthesia Complications Negative for: history of anesthetic complications  Airway Mallampati: II  TM Distance: >3 FB Neck ROM: Full    Dental no notable dental hx. (+) Dental Advisory Given   Pulmonary asthma ,    Pulmonary exam normal        Cardiovascular negative cardio ROS Normal cardiovascular exam     Neuro/Psych negative neurological ROS     GI/Hepatic Neg liver ROS,   Endo/Other  negative endocrine ROS  Renal/GU negative Renal ROS     Musculoskeletal  (+) Arthritis ,   Abdominal   Peds  Hematology negative hematology ROS (+)   Anesthesia Other Findings   Reproductive/Obstetrics                            Anesthesia Physical Anesthesia Plan  ASA: 2  Anesthesia Plan: General   Post-op Pain Management: Tylenol PO (pre-op) and Celebrex PO (pre-op)   Induction: Intravenous  PONV Risk Score and Plan: 4 or greater and Ondansetron, Dexamethasone, Midazolam and Scopolamine patch - Pre-op  Airway Management Planned: Oral ETT  Additional Equipment:   Intra-op Plan:   Post-operative Plan: Extubation in OR  Informed Consent: I have reviewed the patients History and Physical, chart, labs and discussed the procedure including the risks, benefits and alternatives for the proposed anesthesia with the patient or authorized representative who has indicated his/her understanding and acceptance.     Dental advisory given  Plan Discussed with: Anesthesiologist and CRNA  Anesthesia Plan Comments:        Anesthesia Quick Evaluation

## 2022-01-04 ENCOUNTER — Encounter (HOSPITAL_BASED_OUTPATIENT_CLINIC_OR_DEPARTMENT_OTHER): Payer: Self-pay | Admitting: Surgery

## 2022-01-04 ENCOUNTER — Other Ambulatory Visit: Payer: Self-pay

## 2022-01-04 ENCOUNTER — Ambulatory Visit (HOSPITAL_BASED_OUTPATIENT_CLINIC_OR_DEPARTMENT_OTHER): Payer: Medicare Other | Admitting: Certified Registered Nurse Anesthetist

## 2022-01-04 ENCOUNTER — Ambulatory Visit (HOSPITAL_BASED_OUTPATIENT_CLINIC_OR_DEPARTMENT_OTHER)
Admission: RE | Admit: 2022-01-04 | Discharge: 2022-01-04 | Disposition: A | Discharge status: Home / Self Care | Payer: Medicare Other | Attending: Surgery | Admitting: Surgery

## 2022-01-04 ENCOUNTER — Encounter (HOSPITAL_BASED_OUTPATIENT_CLINIC_OR_DEPARTMENT_OTHER)
Admission: RE | Disposition: A | Discharge status: Home / Self Care | Payer: Self-pay | Source: Home / Self Care | Attending: Surgery

## 2022-01-04 DIAGNOSIS — K8010 Calculus of gallbladder with chronic cholecystitis without obstruction: Secondary | ICD-10-CM | POA: Insufficient documentation

## 2022-01-04 DIAGNOSIS — R109 Unspecified abdominal pain: Secondary | ICD-10-CM | POA: Diagnosis present

## 2022-01-04 HISTORY — PX: CHOLECYSTECTOMY: SHX55

## 2022-01-04 HISTORY — DX: Unspecified asthma, uncomplicated: J45.909

## 2022-01-04 HISTORY — DX: Age-related osteoporosis without current pathological fracture: M81.0

## 2022-01-04 HISTORY — DX: Other specified abnormal immunological findings in serum: R76.8

## 2022-01-04 HISTORY — DX: Other specified abnormal immunological findings in serum: R76.89

## 2022-01-04 HISTORY — DX: Cholecystitis, unspecified: K81.9

## 2022-01-04 HISTORY — DX: Unspecified osteoarthritis, unspecified site: M19.90

## 2022-01-04 SURGERY — LAPAROSCOPIC CHOLECYSTECTOMY
Anesthesia: General | Site: Abdomen

## 2022-01-04 MED ORDER — FENTANYL CITRATE (PF) 100 MCG/2ML IJ SOLN
INTRAMUSCULAR | Status: AC
  Filled 2022-01-04: qty 2

## 2022-01-04 MED ORDER — PHENYLEPHRINE 40 MCG/ML (10ML) SYRINGE FOR IV PUSH (FOR BLOOD PRESSURE SUPPORT)
PREFILLED_SYRINGE | INTRAVENOUS | Status: AC
  Filled 2022-01-04: qty 10

## 2022-01-04 MED ORDER — LACTATED RINGERS IV SOLN: INTRAVENOUS | Status: DC

## 2022-01-04 MED ORDER — CELECOXIB 200 MG PO CAPS
200.0000 mg | ORAL_CAPSULE | Freq: Once | ORAL | Status: AC
  Administered 2022-01-04: 200 mg via ORAL

## 2022-01-04 MED ORDER — CHLORHEXIDINE GLUCONATE CLOTH 2 % EX PADS: 6.0000 | MEDICATED_PAD | Freq: Once | CUTANEOUS | Status: DC

## 2022-01-04 MED ORDER — 0.9 % SODIUM CHLORIDE (POUR BTL) OPTIME
TOPICAL | Status: DC | PRN
  Administered 2022-01-04: 1000 mL

## 2022-01-04 MED ORDER — PROPOFOL 10 MG/ML IV BOLUS
INTRAVENOUS | Status: DC | PRN
Start: 2022-01-04 — End: 2022-01-04
  Administered 2022-01-04: 170 mg via INTRAVENOUS

## 2022-01-04 MED ORDER — ACETAMINOPHEN 500 MG PO TABS
ORAL_TABLET | ORAL | Status: AC
  Filled 2022-01-04: qty 2

## 2022-01-04 MED ORDER — ONDANSETRON HCL 4 MG/2ML IJ SOLN
INTRAMUSCULAR | Status: AC
  Filled 2022-01-04: qty 2

## 2022-01-04 MED ORDER — PROPOFOL 10 MG/ML IV BOLUS
INTRAVENOUS | Status: AC
  Filled 2022-01-04: qty 20

## 2022-01-04 MED ORDER — LIDOCAINE HCL (PF) 2 % IJ SOLN
INTRAMUSCULAR | Status: AC
  Filled 2022-01-04: qty 5

## 2022-01-04 MED ORDER — DEXAMETHASONE SODIUM PHOSPHATE 10 MG/ML IJ SOLN
INTRAMUSCULAR | Status: AC
  Filled 2022-01-04: qty 1

## 2022-01-04 MED ORDER — ROCURONIUM BROMIDE 10 MG/ML (PF) SYRINGE
PREFILLED_SYRINGE | INTRAVENOUS | Status: AC
  Filled 2022-01-04: qty 10

## 2022-01-04 MED ORDER — FENTANYL CITRATE (PF) 100 MCG/2ML IJ SOLN
INTRAMUSCULAR | Status: DC | PRN
  Administered 2022-01-04: 100 ug via INTRAVENOUS
  Administered 2022-01-04 (×2): 50 ug via INTRAVENOUS

## 2022-01-04 MED ORDER — ROCURONIUM BROMIDE 10 MG/ML (PF) SYRINGE
PREFILLED_SYRINGE | INTRAVENOUS | Status: DC | PRN
  Administered 2022-01-04: 70 mg via INTRAVENOUS

## 2022-01-04 MED ORDER — MIDAZOLAM HCL 5 MG/5ML IJ SOLN
INTRAMUSCULAR | Status: DC | PRN
  Administered 2022-01-04: 2 mg via INTRAVENOUS

## 2022-01-04 MED ORDER — SODIUM CHLORIDE 0.9 % IR SOLN
Status: DC | PRN
  Administered 2022-01-04: 1000 mL

## 2022-01-04 MED ORDER — LIDOCAINE 2% (20 MG/ML) 5 ML SYRINGE
INTRAMUSCULAR | Status: DC | PRN
  Administered 2022-01-04: 100 mg via INTRAVENOUS

## 2022-01-04 MED ORDER — AMISULPRIDE (ANTIEMETIC) 5 MG/2ML IV SOLN: 10.0000 mg | Freq: Once | INTRAVENOUS | Status: DC | PRN

## 2022-01-04 MED ORDER — SUGAMMADEX SODIUM 200 MG/2ML IV SOLN
INTRAVENOUS | Status: DC | PRN
Start: 2022-01-04 — End: 2022-01-04
  Administered 2022-01-04: 200 mg via INTRAVENOUS

## 2022-01-04 MED ORDER — CELECOXIB 200 MG PO CAPS
ORAL_CAPSULE | ORAL | Status: AC
  Filled 2022-01-04: qty 1

## 2022-01-04 MED ORDER — SCOPOLAMINE 1 MG/3DAYS TD PT72
MEDICATED_PATCH | TRANSDERMAL | Status: AC
  Filled 2022-01-04: qty 1

## 2022-01-04 MED ORDER — CEFAZOLIN SODIUM-DEXTROSE 2-4 GM/100ML-% IV SOLN
INTRAVENOUS | Status: AC
  Filled 2022-01-04: qty 100

## 2022-01-04 MED ORDER — ACETAMINOPHEN 500 MG PO TABS
1000.0000 mg | ORAL_TABLET | Freq: Once | ORAL | Status: AC
Start: 2022-01-04 — End: 2022-01-04
  Administered 2022-01-04: 1000 mg via ORAL

## 2022-01-04 MED ORDER — FENTANYL CITRATE (PF) 100 MCG/2ML IJ SOLN
25.0000 ug | INTRAMUSCULAR | Status: DC | PRN
  Administered 2022-01-04: 25 ug via INTRAVENOUS
  Administered 2022-01-04: 50 ug via INTRAVENOUS

## 2022-01-04 MED ORDER — MIDAZOLAM HCL 2 MG/2ML IJ SOLN
INTRAMUSCULAR | Status: AC
  Filled 2022-01-04: qty 2

## 2022-01-04 MED ORDER — ONDANSETRON HCL 4 MG/2ML IJ SOLN
INTRAMUSCULAR | Status: DC | PRN
  Administered 2022-01-04: 4 mg via INTRAVENOUS

## 2022-01-04 MED ORDER — PROMETHAZINE HCL 25 MG/ML IJ SOLN: 6.2500 mg | INTRAMUSCULAR | Status: DC | PRN

## 2022-01-04 MED ORDER — CEFAZOLIN SODIUM-DEXTROSE 2-4 GM/100ML-% IV SOLN
2.0000 g | INTRAVENOUS | Status: AC
  Administered 2022-01-04: 2 g via INTRAVENOUS

## 2022-01-04 MED ORDER — SCOPOLAMINE 1 MG/3DAYS TD PT72
1.0000 | MEDICATED_PATCH | TRANSDERMAL | Status: DC
  Administered 2022-01-04: 1.5 mg via TRANSDERMAL

## 2022-01-04 MED ORDER — DEXAMETHASONE SODIUM PHOSPHATE 10 MG/ML IJ SOLN
INTRAMUSCULAR | Status: DC | PRN
  Administered 2022-01-04: 10 mg via INTRAVENOUS

## 2022-01-04 MED ORDER — OXYCODONE-ACETAMINOPHEN 5-325 MG PO TABS: 1.0000 | ORAL_TABLET | ORAL | 0 refills | Status: AC | PRN

## 2022-01-04 MED ORDER — FENTANYL CITRATE (PF) 250 MCG/5ML IJ SOLN
INTRAMUSCULAR | Status: AC
  Filled 2022-01-04: qty 5

## 2022-01-04 MED ORDER — PHENYLEPHRINE 40 MCG/ML (10ML) SYRINGE FOR IV PUSH (FOR BLOOD PRESSURE SUPPORT)
PREFILLED_SYRINGE | INTRAVENOUS | Status: DC | PRN
  Administered 2022-01-04: 40 ug via INTRAVENOUS

## 2022-01-04 MED ORDER — BUPIVACAINE HCL 0.25 % IJ SOLN
INTRAMUSCULAR | Status: DC | PRN
  Administered 2022-01-04: 30 mL

## 2022-01-04 SURGICAL SUPPLY — 52 items
ADH SKN CLS APL DERMABOND .7 (GAUZE/BANDAGES/DRESSINGS) ×1
APL PRP STRL LF DISP 70% ISPRP (MISCELLANEOUS) ×1
APPLIER CLIP 5 13 M/L LIGAMAX5 (MISCELLANEOUS) ×2
APR CLP MED LRG 5 ANG JAW (MISCELLANEOUS) ×1
BAG SPEC RTRVL 10 TROC 200 (ENDOMECHANICALS) ×1
BAG SPEC RTRVL LRG 6X4 10 (ENDOMECHANICALS)
CABLE HIGH FREQUENCY MONO STRZ (ELECTRODE) ×2 IMPLANT
CATH URET 5FR 28IN OPEN ENDED (CATHETERS) IMPLANT
CHLORAPREP W/TINT 26 (MISCELLANEOUS) ×2 IMPLANT
CLIP APPLIE 5 13 M/L LIGAMAX5 (MISCELLANEOUS) ×1 IMPLANT
COVER MAYO STAND STRL (DRAPES) ×1 IMPLANT
DERMABOND ADVANCED (GAUZE/BANDAGES/DRESSINGS) ×1
DERMABOND ADVANCED .7 DNX12 (GAUZE/BANDAGES/DRESSINGS) ×1 IMPLANT
DRAPE C-ARM 42X120 X-RAY (DRAPES) ×1 IMPLANT
ELECT REM PT RETURN 9FT ADLT (ELECTROSURGICAL) ×2
ELECTRODE REM PT RTRN 9FT ADLT (ELECTROSURGICAL) ×1 IMPLANT
ENDOLOOP SUT PDS II  0 18 (SUTURE) ×1
ENDOLOOP SUT PDS II 0 18 (SUTURE) IMPLANT
GAUZE 4X4 16PLY ~~LOC~~+RFID DBL (SPONGE) ×2 IMPLANT
GLOVE SRG 8 PF TXTR STRL LF DI (GLOVE) ×1 IMPLANT
GLOVE SURG ENC MOIS LTX SZ6.5 (GLOVE) ×1 IMPLANT
GLOVE SURG ENC MOIS LTX SZ7 (GLOVE) ×1 IMPLANT
GLOVE SURG ENC MOIS LTX SZ7.5 (GLOVE) ×2 IMPLANT
GLOVE SURG POLYISO LF SZ6.5 (GLOVE) ×1 IMPLANT
GLOVE SURG UNDER POLY LF SZ7 (GLOVE) ×2 IMPLANT
GLOVE SURG UNDER POLY LF SZ8 (GLOVE) ×2
GOWN STRL REUS W/TWL LRG LVL3 (GOWN DISPOSABLE) ×6 IMPLANT
GRASPER SUT TROCAR 14GX15 (MISCELLANEOUS) ×1 IMPLANT
IV CATH 14GX2 1/4 (CATHETERS) IMPLANT
IV NS 1000ML (IV SOLUTION) ×2
IV NS 1000ML BAXH (IV SOLUTION) IMPLANT
KIT TURNOVER CYSTO (KITS) ×2 IMPLANT
NDL INSUFFLATION 14GA 120MM (NEEDLE) ×1 IMPLANT
NEEDLE INSUFFLATION 14GA 120MM (NEEDLE) ×2 IMPLANT
NS IRRIG 1000ML POUR BTL (IV SOLUTION) ×2 IMPLANT
PACK BASIN DAY SURGERY FS (CUSTOM PROCEDURE TRAY) ×3 IMPLANT
PAD ARMBOARD 7.5X6 YLW CONV (MISCELLANEOUS) ×1 IMPLANT
POUCH RETRIEVAL ECOSAC 10 (ENDOMECHANICALS) ×1 IMPLANT
POUCH RETRIEVAL ECOSAC 10MM (ENDOMECHANICALS) ×1
POUCH SPECIMEN RETRIEVAL 10MM (ENDOMECHANICALS) IMPLANT
SCISSORS LAP 5X35 DISP (ENDOMECHANICALS) ×2 IMPLANT
SET IRRIG TUBING LAPAROSCOPIC (IRRIGATION / IRRIGATOR) ×2 IMPLANT
SET TUBE SMOKE EVAC HIGH FLOW (TUBING) ×2 IMPLANT
SPONGE T-LAP 18X18 ~~LOC~~+RFID (SPONGE) ×2 IMPLANT
STOPCOCK 4 WAY LG BORE MALE ST (IV SETS) IMPLANT
SUT MNCRL AB 4-0 PS2 18 (SUTURE) ×2 IMPLANT
TOWEL OR 17X26 10 PK STRL BLUE (TOWEL DISPOSABLE) ×2 IMPLANT
TRAY LAPAROSCOPIC (CUSTOM PROCEDURE TRAY) ×2 IMPLANT
TROCAR XCEL NON-BLD 11X100MML (ENDOMECHANICALS) ×2 IMPLANT
TROCAR XCEL NON-BLD 5MMX100MML (ENDOMECHANICALS) ×6 IMPLANT
WARMER LAPAROSCOPE (MISCELLANEOUS) ×2 IMPLANT
WATER STERILE IRR 1000ML POUR (IV SOLUTION) ×1 IMPLANT

## 2022-01-04 NOTE — Anesthesia Postprocedure Evaluation (Signed)
Anesthesia Post Note  Patient: Donna Mason  Procedure(s) Performed: LAPAROSCOPIC CHOLECYSTECTOMY (Abdomen)     Patient location during evaluation: PACU Anesthesia Type: General Level of consciousness: sedated Pain management: pain level controlled Vital Signs Assessment: post-procedure vital signs reviewed and stable Respiratory status: spontaneous breathing and respiratory function stable Cardiovascular status: stable Postop Assessment: no apparent nausea or vomiting Anesthetic complications: no   No notable events documented.  Last Vitals:  Vitals:   01/04/22 0958 01/04/22 1000  BP: 123/76 123/76  Pulse: 72 67  Resp: 19 10  Temp: 36.4 C   SpO2: 95% 98%    Last Pain:  Vitals:   01/04/22 0958  TempSrc:   PainSc: 3                  [name]

## 2022-01-04 NOTE — H&P (Signed)
Admitting Physician: Nickola Major Sruthi Maurer  Service: General surgery  CC: abdominal pain  Subjective   HPI: Donna Mason is an 56 y.o. female who is here for elective laparoscopic cholecystectomy.  Past Medical History:  Diagnosis Date   Cholecystitis    Hepatitis B core antibody positive    per pt per blood work done at dr aryal office   History of cervical dysplasia 1992   s/p leep   History of COVID-19 12/31/2020   per pt mild symptoms that resolved   Mild asthma    followed by pcp   OA (osteoarthritis)    Osteoporosis    RA (rheumatoid arthritis) ([place])    rheumotologist--- dr g. aryal--- multiple sites    Past Surgical History:  Procedure Laterality Date   CERVICAL BIOPSY  W/ LOOP ELECTRODE EXCISION  1992   TOTAL KNEE ARTHROPLASTY Right 2018    Family History  Problem Relation Age of Onset   Breast cancer Neg Hx     Social:  reports that she has never smoked. She has never used smokeless tobacco. She reports that she does not currently use alcohol. She reports that she does not use drugs.  Allergies: No Known Allergies  Medications: Current Outpatient Medications  Medication Instructions   ALBUTEROL IN Inhalation, As needed   alendronate (FOSAMAX) 70 mg, Oral, Weekly, Sunday's ---Take with a full glass of water on an empty stomach.   Calcium Carb-Cholecalciferol (CALCIUM 600+D3 PO) 1 tablet, Oral, 3 times weekly   Ferrous Gluconate-C-Folic Acid (IRON-C PO) 2 tablets, Oral, 3 times weekly   Golimumab (SIMPONI ARIA IV) Intravenous, Every 8 weeks   HYDROcodone-acetaminophen (NORCO/VICODIN) 5-325 MG tablet 1 tablet, Oral, Every 6 hours PRN   Multiple Vitamins-Minerals (MULTIVITAMIN ADULT EXTRA C) CHEW Oral, Daily   predniSONE (DELTASONE) 5 mg, Oral, Daily with breakfast    ROS - all of the below systems have been reviewed with the patient and positives are indicated with bold text General: chills, fever or night sweats Eyes: blurry vision or  double vision ENT: epistaxis or sore throat Allergy/Immunology: itchy/watery eyes or nasal congestion Hematologic/Lymphatic: bleeding problems, blood clots or swollen lymph nodes Endocrine: temperature intolerance or unexpected weight changes Breast: new or changing breast lumps or nipple discharge Resp: cough, shortness of breath, or wheezing CV: chest pain or dyspnea on exertion GI: as per HPI GU: dysuria, trouble voiding, or hematuria MSK: joint pain or joint stiffness Neuro: TIA or stroke symptoms Derm: pruritus and skin lesion changes Psych: anxiety and depression  Objective   PE Blood pressure (!) 149/95, pulse 78, temperature 98.7 F (37.1 C), temperature source Oral, resp. rate 16, height 5\' 3"  (1.6 m), weight 84.1 kg, SpO2 96 %. Constitutional: NAD; conversant; no deformities Eyes: Moist conjunctiva; no lid lag; anicteric; PERRL Neck: Trachea midline; no thyromegaly Lungs: Normal respiratory effort; no tactile fremitus CV: RRR; no palpable thrills; no pitting edema GI: Abd soft, nontender; no palpable hepatosplenomegaly MSK: Normal range of motion of extremities; no clubbing/cyanosis Psychiatric: Appropriate affect; alert and oriented x3 Lymphatic: No palpable cervical or axillary lymphadenopathy  No results found for this or any previous visit (from the past 24 hour(s)).  Imaging Orders  No imaging studies ordered today   RU[facility] ultrasound 07/08/21  1. Limited study without evidence of acute cholecystitis. 2. Approximately 6 mm polyp versus adherent stone at the gallbladder Fundus.   Assessment and Plan   Donna Mason is an 56 y.o. female with cholelithiasis and biliary colic.  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 I recommended laparoscopic cholecystectomy. The procedure itself as well as its risk, benefits, and alternatives  were discussed with the patient. The risk discussed included were not limited to the risk of infection, bleeding, damage nearby structures, bile leak, common bile duct injury. After full discussion all questions answered the patient granted consent to proceed.      Felicie Morn, MD  [facility] Use [email or web address] to contact on call provider

## 2022-01-04 NOTE — Op Note (Signed)
Patient: Lichelle Herschberger (1966/05/09, [id number])  Date of Surgery: 01/04/2022   Preoperative Diagnosis: CHOLECYSTITS   Postoperative Diagnosis: CHOLECYSTITS   Surgical Procedure: LAPAROSCOPIC CHOLECYSTECTOMY:    Operative Team Members:  Surgeon(s) and Role:    * Natanael Saladin, Nickola Major, MD - Primary   Anesthesiologist: Duane Boston, MD CRNA: Rogers Blocker, CRNA; Georgeanne Nim, CRNA   Anesthesia: General   Fluids:  Total I/O In: 100 [IV 0000000 Out: -   Complications: None  Drains:  none   Specimen:  ID Type Source Tests Collected by Time Destination  1 : gallbladder Tissue PATH Gallbladder SURGICAL PATHOLOGY Nusrat Encarnacion, Nickola Major, MD 01/04/2022 0750      Disposition:  PACU - hemodynamically stable.  Plan of Care: Discharge to home after PACU    Indications for Procedure: Remiah Casarella is a 56 y.o. female who presented with abdominal pain.  History, physical and imaging was concerning for gallbladder polyp or cholelithiasis with biliary colic.  Laparoscopic cholecystectomy was recommended for the patient.  The procedure itself, as well as the risks, benefits and alternatives were discussed with the patient.  Risks discussed included but were not limited to the risk of infection, bleeding, damage to nearby structures, need to convert to open procedure, incisional hernia, bile leak, common bile duct injury and the need for additional procedures or surgeries.  With this discussion complete and all questions answered the patient granted consent to proceed.  Findings: Mild inflammation around infundibulum of gallbladder.  Small amount of scar tissue between the liver and the diaphragm  Infection status: Patient: Private Patient Elective Case Case: Elective Infection Present At Time Of Surgery (PATOS):  some spillage of bile   Description of Procedure:   On the date stated above, the patient was taken to the operating room suite and placed in  supine positioning.  Sequential compression devices were placed on the lower extremities to prevent blood clots.  General endotracheal anesthesia was induced. Preoperative antibiotics were given within 30 minutes of incision.  The patient's abdomen was prepped and draped in the usual sterile fashion.  A time-out was completed verifying the correct patient, procedure, positioning and equipment needed for the case.  We began by anesthetizing the skin with local anesthetic and then making a 5 mm incision just below the umbilicus.  We dissected through the subcutaneous tissues to the fascia.  The fascia was grasped and elevated using a Kocher clamp.  A Veress needle was inserted into the abdomen and the abdomen was insufflated to 15 mmHg.  A 5 mm trocar was inserted in this position under optical guidance and then the abdomen was inspected.  There was no trauma to the underlying viscera with initial trocar placement.  Any abnormal findings, other than inflammation in the right upper quadrant, are listed above in the findings section.  Three additional trocars were placed, one 12 mm trocar in the subxiphoid position, one 5 mm trocar in the midline epigastric area and one 35mm trocar in the right upper quadrant subcostally.  These were placed under direct vision without any trauma to the underlying viscera.    The patient was then placed in head up, left side down positioning.  The gallbladder was identified and dissected free from its attachments to the omentum allowing the duodenum to fall away.  The infundibulum of the gallbladder was dissected free working laterally to medially.  The cystic duct and cystic artery were dissected free from surrounding connective tissue.  The infundibulum of the  gallbladder was dissected off the cystic plate.  A critical view of safety was obtained with the cystic duct and cystic artery being cleared of connective tissues and clearly the only two structures entering into the  gallbladder with the liver clearly visible behind.  Clips were then applied to the cystic duct and cystic artery and then these structures were divided.  The gallbladder was dissected off the cystic plate, placed in an endocatch bag and removed from the 12 mm subxiphoid port site.  The clips were inspected and appeared effective.  The cystic plate was inspected and hemostasis was obtained using electrocautery.  A suction irrigator was used to clean the operative field.  Attention was turned to closure.  The 12 mm subxiphoid port site was closed using a 0-vicryl suture on a fascial suture passer.  The abdomen was desufflated.  The skin was closed using 4-0 monocryl and dermabond.  All sponge and needle counts were correct at the conclusion of the case.    Louanna Raw, MD General, Bariatric, & Minimally Invasive Surgery [facility] [place]

## 2022-01-04 NOTE — Anesthesia Procedure Notes (Signed)
Procedure Name: Intubation Date/Time: 01/04/2022 8:17 AM Performed by: Rogers Blocker, CRNA Pre-anesthesia Checklist: Patient identified, Emergency Drugs available, Suction available and Patient being monitored Patient Re-evaluated:Patient Re-evaluated prior to induction Oxygen Delivery Method: Circle System Utilized Preoxygenation: Pre-oxygenation with 100% oxygen Induction Type: IV induction Ventilation: Mask ventilation without difficulty Laryngoscope Size: 3 and Mac Grade View: Grade I Tube type: Oral Tube size: 7.0 mm Number of attempts: 1 Airway Equipment and Method: Stylet and Bite block Placement Confirmation: ETT inserted through vocal cords under direct vision, positive ETCO2 and breath sounds checked- equal and bilateral Secured at: 22 cm Tube secured with: Tape Dental Injury: Teeth and Oropharynx as per pre-operative assessment

## 2022-01-04 NOTE — Transfer of Care (Signed)
Immediate Anesthesia Transfer of Care Note  Patient: Donna Mason  Procedure(s) Performed: LAPAROSCOPIC CHOLECYSTECTOMY (Abdomen)  Patient Location: PACU  Anesthesia Type:General  Level of Consciousness: awake, alert , oriented and patient cooperative  Airway & Oxygen Therapy: Patient Spontanous Breathing and Patient connected to nasal cannula oxygen  Post-op Assessment: Report given to RN and Post -op Vital signs reviewed and stable  Post vital signs: Reviewed and stable  Last Vitals:  Vitals Value Taken Time  BP 124/73 01/04/22 0905  Temp    Pulse    Resp 13 01/04/22 0907  SpO2    Vitals shown include unvalidated device data.  Last Pain:  Vitals:   01/04/22 0700  TempSrc: Oral  PainSc: 0-No pain      Patients Stated Pain Goal: 5 (01/04/22 0700)  Complications: No notable events documented.

## 2022-01-04 NOTE — Discharge Instructions (Addendum)
CHOLECYSTECTOMY POST OPERATIVE INSTRUCTIONS  Thinking Clearly  The anesthesia may cause you to feel different for 1 or 2 days. Do not drive, drink alcohol, or make any big decisions for at least 2 days.  Nutrition When you wake up, you will be able to drink small amounts of liquid. If you do not feel sick, you can slowly advance your diet to regular foods. Continue to drink lots of fluids, usually about 8 to 10 glasses per day. Eat a high-fiber diet so you dont strain during bowel movements. High-Fiber Foods Foods high in fiber include beans, bran cereals and whole-grain breads, peas, dried fruit (figs, apricots, and dates), raspberries, blackberries, strawberries, sweet corn, broccoli, baked potatoes with skin, plums, pears, apples, greens, and nuts. Activity Slowly increase your activity. Be sure to get up and walk every hour or so to prevent blood clots. No heavy lifting or strenuous activity for 4 weeks following surgery to prevent hernias at your incision sites It is normal to feel tired. You may need more sleep than usual.  Get your rest but make sure to get up and move around frequently to prevent blood clots and pneumonia.  Work and Return to [employer] can go back to work when you feel well enough. Discuss the timing with your surgeon. You can usually go back to school or work 1 week after an operation. If your work requires heavy lifting or strenuous activity you need to be placed on light duty for 4 weeks following surgery. You can return to gym class, sports or other physical activities 4 weeks after surgery.  Wound Care Always wash your hands before and after touching near your incision site. Do not soak in a bathtub until cleared at your follow up appointment. You may take a shower 24 hours after surgery. A small amount of drainage from the incision is normal. If the drainage is thick and yellow or the site is red, you may have an infection, so call your surgeon. If you  have a drain in one of your incisions, it will be taken out in office when the drainage stops. Steri-Strips will fall off in 7 to 10 days or they will be removed during your first office visit. If you have dermabond glue covering over the incision, allow the glue to flake off on its own. Avoid wearing tight or rough clothing. It may rub your incisions and make it harder for them to heal. Protect the new skin, especially from the sun. The sun can burn and cause darker scarring. Your scar will heal in about 4 to 6 weeks and will become softer and continue to fade over the next year.  The cosmetic appearance of the incisions will improve over the course of the first year after surgery. Sensation around your incision will return in a few weeks or months.  Bowel Movements After intestinal surgery, you may have loose watery stools for several days. If watery diarrhea lasts longer than 3 days, contact your surgeon. Pain medication (narcotics) can cause constipation. Increase the fiber in your diet with high-fiber foods if you are constipated. You can take an over the counter stool softener like Colace to avoid constipation.  Additional over the counter medications can also be used if Colace isn't sufficient (for example, Milk of Magnesia or Miralax).  Pain The amount of pain is different for each person. Some people need only 1 to 3 doses of pain control medication, while others need more. Take alternating doses of tylenol  and ibuprofen around the clock for the first five days following surgery.  This will provide a baseline of pain control and help with inflammation.  Take the narcotic pain medication in addition if needed for severe pain.  Contact Your Surgeon at [phone], if you have: Pain in your right upper abdomen like a gallbladder attack. Pain that will not go away Pain that gets worse A fever of more than 101F (38.3C) Repeated vomiting Swelling, redness, bleeding, or bad-smelling  drainage from your wound site Strong abdominal pain No bowel movement or unable to pass gas for 3 days Watery diarrhea lasting longer than 3 days  Pain Control The goal of pain control is to minimize pain, keep you moving and help you heal. Your surgical team will work with you on your pain plan. Most often a combination of therapies and medications are used to control your pain. You may also be given medication (local anesthetic) at the surgical site. This may help control your pain for several days. Extreme pain puts extra stress on your body at a time when your body needs to focus on healing. Do not wait until your pain has reached a level 10 or is unbearable before telling your doctor or nurse. It is much easier to control pain before it becomes severe. Following a laparoscopic procedure, pain is sometimes felt in the shoulder. This is due to the gas inserted into your abdomen during the procedure. Moving and walking helps to decrease the gas and the right shoulder pain.  Use the guide below for ways to manage your post-operative pain. Learn more by going to facs.org/safepaincontrol.  How Intense Is My Pain Common Therapies to Feel Better       I hardly notice my pain, and it does not interfere with my activities.  I notice my pain and it distracts me, but I can still do activities (sitting up, walking, standing).  Non-Medication Therapies  Ice (in a bag, applied over clothing at the surgical site), elevation, rest, meditation, massage, distraction (music, TV, play) walking and mild exercise Splinting the abdomen with pillows +  Non-Opioid Medications Acetaminophen (Tylenol) Non-steroidal anti-inflammatory drugs (NSAIDS) Aspirin, Ibuprofen (Motrin, Advil) Naproxen (Aleve) Take these as needed, when you feel pain. Both acetaminophen and NSAIDs help to decrease pain and swelling (inflammation).      My pain is hard to ignore and is more noticeable even when I rest.  My  pain interferes with my usual activities.  Non-Medication Therapies  +  Non-Opioid medications  Take on a regular schedule (around-the-clock) instead of as needed. (For example, Tylenol every 6 hours at 9:00 am, 3:00 pm, 9:00 pm, 3:00 am and Motrin every 6 hours at 12:00 am, 6:00 am, 12:00 pm, 6:00 pm)         I am focused on my pain, and I am not doing my daily activities.  I am groaning in pain, and I cannot sleep. I am unable to do anything.  My pain is as bad as it could be, and nothing else matters.  Non-Medication Therapies  +  Around-the-Clock Non-Opioid Medications  +  Short-acting opioids  Opioids should be used with other medications to manage severe pain. Opioids block pain and give a feeling of euphoria (feel high). Addiction, a serious side effect of opioids, is rare with short-term (a few days) use.  Examples of short-acting opioids include: Tramadol (Ultram), Hydrocodone (Norco, Vicodin), Hydromorphone (Dilaudid), Oxycodone (Oxycontin)     The above directions have been adapted from  the [employer] of [employer] Surgical Patient Education Program.  Please refer to the ACS website if needed: FreakyMates.de.ashx.   Ivar Drape, MD [facility] [address], [place], [place]  [address] ?  [address] [address], [place], [place]   [address] [phone] ? [phone] ? FAX [phone] Web site: [email or web address]      Post Anesthesia Home Care Instructions  Activity: Get plenty of rest for the remainder of the day. A responsible individual must stay with you for 24 hours following the procedure.  For the next 24 hours, DO NOT: -Drive a car -Advertising copywriter -Drink alcoholic beverages -Take any medication unless instructed by your physician -Make any legal decisions or sign important papers.  Meals: Start with liquid foods such as gelatin  or soup. Progress to regular foods as tolerated. Avoid greasy, spicy, heavy foods. If nausea and/or vomiting occur, drink only clear liquids until the nausea and/or vomiting subsides. Call your physician if vomiting continues.  Special Instructions/Symptoms: Your throat may feel dry or sore from the anesthesia or the breathing tube placed in your throat during surgery. If this causes discomfort, gargle with warm salt water. The discomfort should disappear within 24 hours.  If you had a scopolamine patch placed behind your ear for the management of post- operative nausea and/or vomiting:  1. The medication in the patch is effective for 72 hours, after which it should be removed.  Wrap patch in a tissue and discard in the trash. Wash hands thoroughly with soap and water. 2. You may remove the patch earlier than 72 hours if you experience unpleasant side effects which may include dry mouth, dizziness or visual disturbances. 3. Avoid touching the patch. Wash your hands with soap and water after contact with the patch.  Do not take any Tylenol or nonsteroidal anti inflammatories until after 1:00 pm today

## 2022-01-05 LAB — SURGICAL PATHOLOGY

## 2022-01-06 ENCOUNTER — Encounter (HOSPITAL_BASED_OUTPATIENT_CLINIC_OR_DEPARTMENT_OTHER): Payer: Self-pay | Admitting: Surgery

## 2022-05-10 ENCOUNTER — Encounter (HOSPITAL_BASED_OUTPATIENT_CLINIC_OR_DEPARTMENT_OTHER): Payer: Self-pay

## 2022-05-10 ENCOUNTER — Other Ambulatory Visit: Payer: Self-pay

## 2022-05-10 ENCOUNTER — Emergency Department (HOSPITAL_BASED_OUTPATIENT_CLINIC_OR_DEPARTMENT_OTHER)
Admission: EM | Admit: 2022-05-10 | Discharge: 2022-05-10 | Disposition: A | Discharge status: Home / Self Care | Payer: Medicare Other | Attending: Emergency Medicine | Admitting: Emergency Medicine

## 2022-05-10 ENCOUNTER — Other Ambulatory Visit (HOSPITAL_BASED_OUTPATIENT_CLINIC_OR_DEPARTMENT_OTHER): Payer: Self-pay

## 2022-05-10 DIAGNOSIS — R300 Dysuria: Secondary | ICD-10-CM | POA: Diagnosis not present

## 2022-05-10 DIAGNOSIS — R109 Unspecified abdominal pain: Secondary | ICD-10-CM | POA: Diagnosis not present

## 2022-05-10 DIAGNOSIS — R3915 Urgency of urination: Secondary | ICD-10-CM | POA: Insufficient documentation

## 2022-05-10 DIAGNOSIS — N390 Urinary tract infection, site not specified: Secondary | ICD-10-CM

## 2022-05-10 LAB — URINALYSIS, ROUTINE W REFLEX MICROSCOPIC
Bilirubin Urine: NEGATIVE
Glucose, UA: NEGATIVE mg/dL
Hgb urine dipstick: NEGATIVE
Ketones, ur: NEGATIVE mg/dL
Nitrite: NEGATIVE
Specific Gravity, Urine: 1.024 (ref 1.005–1.030)
pH: 5.5 (ref 5.0–8.0)

## 2022-05-10 MED ORDER — CEFDINIR 300 MG PO CAPS
300.0000 mg | ORAL_CAPSULE | Freq: Two times a day (BID) | ORAL | 0 refills | Status: DC
  Filled 2022-05-10: qty 14, 7d supply, fill #0

## 2022-05-10 NOTE — ED Provider Notes (Signed)
[facility] EMERGENCY DEPT Provider Note   CSN: [id number] Arrival date & time: 05/10/22  1029     History  Chief Complaint  Patient presents with   Flank Pain    Donna Mason is a 56 y.o. female.  56 year old female who presents with left-sided flank pain.  She is also noted some dysuria.  No fever or chills.  Pain seems to wax and wane.  No prior history of kidney stones.  Has noted some urinary urgency.  No treatment use prior to arrival      Home Medications Prior to Admission medications   Medication Sig Start Date End Date Taking? Authorizing Provider  ALBUTEROL IN Inhale into the lungs as needed.    [provider]  alendronate (FOSAMAX) 70 MG tablet Take 70 mg by mouth once a week. Sunday's ---Take with a full glass of water on an empty stomach.    [provider]  Calcium Carb-Cholecalciferol (CALCIUM 600+D3 PO) Take 1 tablet by mouth 3 (three) times a week.    [provider]  Ferrous Gluconate-C-Folic Acid (IRON-C PO) Take 2 tablets by mouth 3 (three) times a week.    [provider]  Golimumab (SIMPONI ARIA IV) Inject into the vein every 8 (eight) weeks.    [provider]  Multiple Vitamins-Minerals (MULTIVITAMIN ADULT EXTRA C) CHEW Chew by mouth daily.    [provider]  oxyCODONE-acetaminophen (PERCOCET) 5-325 MG tablet Take 1 tablet by mouth every 4 (four) hours as needed for severe pain. 01/04/22 01/04/23  Stechschulte, Hyman Hopes, MD  predniSONE (DELTASONE) 5 MG tablet Take 5 mg by mouth daily with breakfast.    [provider]      Allergies    Patient has no known allergies.    Review of Systems   Review of Systems  All other systems reviewed and are negative.  Physical Exam Updated Vital Signs BP (!) 147/91 (BP Location: Right Arm)   Pulse 67   Temp 98.3 F (36.8 C) (Oral)   Resp 18   Ht 1.6 m (5\' 3" )   Wt 82.6 kg   SpO2 100%   BMI 32.24 kg/m  Physical Exam Vitals  and nursing note reviewed.  Constitutional:      General: She is not in acute distress.    Appearance: Normal appearance. She is well-developed. She is not toxic-appearing.  HENT:     Head: Normocephalic and atraumatic.  Eyes:     General: Lids are normal.     Conjunctiva/sclera: Conjunctivae normal.     Pupils: Pupils are equal, round, and reactive to light.  Neck:     Thyroid: No thyroid mass.     Trachea: No tracheal deviation.  Cardiovascular:     Rate and Rhythm: Normal rate and regular rhythm.     Heart sounds: Normal heart sounds. No murmur heard.   No gallop.  Pulmonary:     Effort: Pulmonary effort is normal. No respiratory distress.     Breath sounds: Normal breath sounds. No stridor. No decreased breath sounds, wheezing, rhonchi or rales.  Abdominal:     General: There is no distension.     Palpations: Abdomen is soft.     Tenderness: There is no abdominal tenderness. There is no left CVA tenderness or rebound.  Musculoskeletal:        General: No tenderness. Normal range of motion.     Cervical back: Normal range of motion and neck supple.  Skin:  General: Skin is warm and dry.     Findings: No abrasion or rash.  Neurological:     Mental Status: She is alert and oriented to person, place, and time. Mental status is at baseline.     GCS: GCS eye subscore is 4. GCS verbal subscore is 5. GCS motor subscore is 6.     Cranial Nerves: No cranial nerve deficit.     Sensory: No sensory deficit.     Motor: Motor function is intact.  Psychiatric:        Attention and Perception: Attention normal.        Speech: Speech normal.        Behavior: Behavior normal.    ED Results / Procedures / Treatments   Labs (all labs ordered are listed, but only abnormal results are displayed) Labs Reviewed  URINALYSIS, ROUTINE W REFLEX MICROSCOPIC - Abnormal; Notable for the following components:      Result Value   Protein, ur TRACE (*)    Leukocytes,Ua LARGE (*)    Bacteria, UA  RARE (*)    All other components within normal limits    EKG None  Radiology No results found.  Procedures Procedures    Medications Ordered in ED Medications - No data to display  ED Course/ Medical Decision Making/ A&P                           Medical Decision Making Amount and/or Complexity of Data Reviewed Labs: ordered.   Urinalysis consistent with infection.  No evidence of pyelonephritis.  Will treat with antibiotics and return precautions given        Final Clinical Impression(s) / ED Diagnoses Final diagnoses:  None    Rx / DC Orders ED Discharge Orders     None         Lorre Nick, MD 05/10/22 1315

## 2022-05-10 NOTE — ED Triage Notes (Signed)
Pt c/o left side pain since Saturday. She thought initially she could have pulled a muscle while doing some heavy lifting. Her concern today is that she might have an issue with her kidney, states she is having difficulty starting a stream. Pt denies any other symptoms.

## 2022-05-10 NOTE — Discharge Instructions (Addendum)
Use Tylenol or Motrin as directed for pain 

## 2022-05-16 ENCOUNTER — Other Ambulatory Visit: Payer: Self-pay | Admitting: Family Medicine

## 2022-05-16 DIAGNOSIS — Z1231 Encounter for screening mammogram for malignant neoplasm of breast: Secondary | ICD-10-CM

## 2022-05-31 ENCOUNTER — Ambulatory Visit: Payer: Medicare Other

## 2022-06-02 ENCOUNTER — Ambulatory Visit
Admission: RE | Admit: 2022-06-02 | Discharge: 2022-06-02 | Disposition: A | Discharge status: Home / Self Care | Payer: Medicare Other | Source: Ambulatory Visit | Attending: Family Medicine | Admitting: Family Medicine

## 2022-06-02 DIAGNOSIS — Z1231 Encounter for screening mammogram for malignant neoplasm of breast: Secondary | ICD-10-CM

## 2023-05-10 ENCOUNTER — Other Ambulatory Visit: Payer: Self-pay | Admitting: Family Medicine

## 2023-05-10 DIAGNOSIS — Z1231 Encounter for screening mammogram for malignant neoplasm of breast: Secondary | ICD-10-CM

## 2023-06-14 ENCOUNTER — Ambulatory Visit
Admission: RE | Admit: 2023-06-14 | Discharge: 2023-06-14 | Disposition: A | Discharge status: Home / Self Care | Payer: Medicare Other | Source: Ambulatory Visit | Attending: Family Medicine | Admitting: Family Medicine

## 2023-06-14 DIAGNOSIS — Z1231 Encounter for screening mammogram for malignant neoplasm of breast: Secondary | ICD-10-CM

## 2024-05-05 ENCOUNTER — Other Ambulatory Visit: Payer: Self-pay | Admitting: Family Medicine

## 2024-05-05 DIAGNOSIS — Z1231 Encounter for screening mammogram for malignant neoplasm of breast: Secondary | ICD-10-CM

## 2024-06-16 ENCOUNTER — Ambulatory Visit

## 2024-06-25 ENCOUNTER — Ambulatory Visit
Admission: RE | Admit: 2024-06-25 | Discharge: 2024-06-25 | Disposition: A | Discharge status: Home / Self Care | Source: Ambulatory Visit | Attending: Family Medicine | Admitting: Family Medicine

## 2024-06-25 DIAGNOSIS — Z1231 Encounter for screening mammogram for malignant neoplasm of breast: Secondary | ICD-10-CM

## 2024-09-16 ENCOUNTER — Other Ambulatory Visit: Payer: Self-pay | Admitting: Rheumatology

## 2024-09-16 DIAGNOSIS — M7918 Myalgia, other site: Secondary | ICD-10-CM

## 2024-09-16 DIAGNOSIS — M25551 Pain in right hip: Secondary | ICD-10-CM

## 2024-09-16 DIAGNOSIS — M0609 Rheumatoid arthritis without rheumatoid factor, multiple sites: Secondary | ICD-10-CM

## 2024-09-25 ENCOUNTER — Encounter: Payer: Self-pay | Admitting: Rheumatology

## 2024-09-30 NOTE — Progress Notes (Unsigned)
   LILLETTE Ileana Collet, PhD, LAT, ATC acting as a scribe for Artist Lloyd, MD.  Donna Mason is a 58 y.o. female who presents to [facility] today for L hip pain x ***. Pt locates pain to ***  Radiates: Aggravates: Treatments tried: tramadol,   Dx testing: 11/25/23 Bilat hips/pelvis  Pertinent review of systems: ***  Relevant historical information: ***   Exam:  There were no vitals taken for this visit. General: Well Developed, well nourished, and in no acute distress.   MSK: ***    Lab and Radiology Results No results found for this or any previous visit (from the past 72 hours). No results found.     Assessment and Plan: 58 y.o. female with ***   PDMP not reviewed this encounter. No orders of the defined types were placed in this encounter.  No orders of the defined types were placed in this encounter.    Discussed warning signs or symptoms. Please see discharge instructions. Patient expresses understanding.   ***

## 2024-10-01 ENCOUNTER — Other Ambulatory Visit: Payer: Self-pay

## 2024-10-01 ENCOUNTER — Ambulatory Visit: Admitting: Family Medicine

## 2024-10-01 ENCOUNTER — Encounter: Payer: Self-pay | Admitting: Family Medicine

## 2024-10-01 VITALS — BP 114/78 | HR 94 | Ht 63.0 in | Wt 166.0 lb

## 2024-10-01 DIAGNOSIS — T380X5A Adverse effect of glucocorticoids and synthetic analogues, initial encounter: Secondary | ICD-10-CM | POA: Diagnosis not present

## 2024-10-01 DIAGNOSIS — M5416 Radiculopathy, lumbar region: Secondary | ICD-10-CM

## 2024-10-01 DIAGNOSIS — M25552 Pain in left hip: Secondary | ICD-10-CM | POA: Diagnosis not present

## 2024-10-01 DIAGNOSIS — M818 Other osteoporosis without current pathological fracture: Secondary | ICD-10-CM | POA: Diagnosis not present

## 2024-10-01 NOTE — Patient Instructions (Addendum)
 Thank you for coming in today.   Contact [facility] to get another back injection   I've order a Bone Density Scan to our [place] office. Schedule that appointment at our front desk before you leave today  Let me know when you get the MRI results back  Consent from [facility]

## 2024-10-03 ENCOUNTER — Encounter: Payer: Self-pay | Admitting: Rheumatology

## 2024-10-13 ENCOUNTER — Other Ambulatory Visit

## 2024-10-15 ENCOUNTER — Ambulatory Visit (INDEPENDENT_AMBULATORY_CARE_PROVIDER_SITE_OTHER)
Admission: RE | Admit: 2024-10-15 | Discharge: 2024-10-15 | Disposition: A | Discharge status: Home / Self Care | Source: Ambulatory Visit | Attending: Family Medicine | Admitting: Family Medicine

## 2024-10-15 DIAGNOSIS — T380X5A Adverse effect of glucocorticoids and synthetic analogues, initial encounter: Secondary | ICD-10-CM

## 2024-10-15 DIAGNOSIS — M818 Other osteoporosis without current pathological fracture: Secondary | ICD-10-CM

## 2024-10-17 ENCOUNTER — Ambulatory Visit: Payer: Self-pay | Admitting: Family Medicine

## 2024-10-17 NOTE — Progress Notes (Signed)
 Bone density test does show osteoporosis.  Recommend scheduling back with me once we get the MRI results back

## 2024-10-29 ENCOUNTER — Ambulatory Visit
Admission: RE | Admit: 2024-10-29 | Discharge: 2024-10-29 | Disposition: A | Discharge status: Home / Self Care | Source: Ambulatory Visit | Attending: Rheumatology | Admitting: Rheumatology

## 2024-10-29 DIAGNOSIS — M0609 Rheumatoid arthritis without rheumatoid factor, multiple sites: Secondary | ICD-10-CM

## 2024-10-29 DIAGNOSIS — M7918 Myalgia, other site: Secondary | ICD-10-CM

## 2024-10-29 DIAGNOSIS — M25551 Pain in right hip: Secondary | ICD-10-CM

## 2025-01-07 NOTE — Progress Notes (Unsigned)
 "  Office Visit Note  Patient: Donna Mason             Date of Birth: 02/24/1966           MRN: [id number]             PCP: Gerome Brunet, DO Referring: Curt Lighter, MD Visit Date: 01/21/2025 Occupation: Data Unavailable  Subjective:  No chief complaint on file.   History of Present Illness: Donna Mason is a 59 y.o. female ***with seronegative rheumatoid arthritis.  Patient was seen for a second opinion for chronic hip pain.  Patient was diagnosed with seronegative rheumatoid arthritis in 1998 in [place] for polyarthritis.  She established with Dr. Curt in 2019.  Previous treatment included methotrexate which was discontinued due to GI side effects, hair loss and an inadequate response.  Leflunomide was discontinued due to GI side effects.  She lost deficiency to Humira and Orencia.  She was started on Simponi.  She stayed on prednisone 2.5 mg and taper.  In March 2025 she started experiencing right buttock pain and right hip pain.  She had MRI of her hip joint which was unremarkable.  She was under care of orthopedics and received tramadol.    Activities of Daily Living:  Patient reports morning stiffness for *** {minute/hour:19697}.   Patient {ACTIONS;DENIES/REPORTS:21021675::Denies} nocturnal pain.  Difficulty dressing/grooming: {ACTIONS;DENIES/REPORTS:21021675::Denies} Difficulty climbing stairs: {ACTIONS;DENIES/REPORTS:21021675::Denies} Difficulty getting out of chair: {ACTIONS;DENIES/REPORTS:21021675::Denies} Difficulty using hands for taps, buttons, cutlery, and/or writing: {ACTIONS;DENIES/REPORTS:21021675::Denies}  No Rheumatology ROS completed.   PMFS History:  Patient Active Problem List   Diagnosis Date Noted   RA (rheumatoid arthritis) (HCC)     Past Medical History:  Diagnosis Date   Cholecystitis    Hepatitis B core antibody positive    per pt per blood work done at dr aryal office   History of cervical dysplasia 1992   s/p  leep   History of COVID-19 12/31/2020   per pt mild symptoms that resolved   Mild asthma    followed by pcp   OA (osteoarthritis)    Osteoporosis    RA (rheumatoid arthritis) (HCC)    rheumotologist--- dr g. aryal--- multiple sites    Family History  Problem Relation Age of Onset   Breast cancer Neg Hx    Past Surgical History:  Procedure Laterality Date   CERVICAL BIOPSY  W/ LOOP ELECTRODE EXCISION  1992   CHOLECYSTECTOMY N/A 01/04/2022   Procedure: LAPAROSCOPIC CHOLECYSTECTOMY;  Surgeon: Lyndel Deward PARAS, MD;  Location: [facility];  Service: General;  Laterality: N/A;   TOTAL KNEE ARTHROPLASTY Right 2018   Social History[1] Social History   Social History Narrative   Not on file     Immunization History  Administered Date(s) Administered   PFIZER Comirnaty(Gray Top)Covid-19 Tri-Sucrose Vaccine 11/06/2020   PFIZER(Purple Top)SARS-COV-2 Vaccination 03/04/2020, 03/29/2020     Objective: Vital Signs: There were no vitals taken for this visit.   Physical Exam   Musculoskeletal Exam: ***  CDAI Exam: CDAI Score: -- Patient Global: --; Provider Global: -- Swollen: --; Tender: -- Joint Exam 01/21/2025   No joint exam has been documented for this visit   There is currently no information documented on the homunculus. Go to the Rheumatology activity and complete the homunculus joint exam.  Investigation: No additional findings.  Imaging: No results found.  Recent Labs: Lab Results  Component Value Date   WBC 5.9 07/08/2021   HGB 11.1 (L) 07/08/2021   PLT 192  07/08/2021   NA 140 07/08/2021   K 4.1 07/08/2021   CL 104 07/08/2021   CO2 28 07/08/2021   GLUCOSE 80 07/08/2021   BUN 8 07/08/2021   CREATININE 0.68 07/08/2021   BILITOT 0.8 07/08/2021   ALKPHOS 76 07/08/2021   AST 14 (L) 07/08/2021   ALT 13 07/08/2021   PROT 6.4 (L) 07/08/2021   ALBUMIN 3.5 07/08/2021   CALCIUM 8.4 (L) 07/08/2021   February 12, 2024 CRP 4, sed rate 25, CBC  normal, LDL 102, CMP normal, hemoglobin A1c 5.8, vitamin D333.5, TSH normal, December 18, 2022 sed rate 52 September 01, 2021 TB Gold negative, hepatitis A nonreactive, hepatitis B core antibody IgM nonreactive, hepatitis B surface antigen nonreactive, hepatitis C nonreactive September 17, 2020 hepatitis B core antibody total positive  Speciality Comments: Methotrexate-GI side effects, hair loss, inadequate response, leflunomide GI side effects, Humira and Orencia-lost efficiency  Procedures:  No procedures performed Allergies: Patient has no known allergies.   Assessment / Plan:     Visit Diagnoses: Seronegative rheumatoid arthritis (HCC) - dxd 1998 in [place], followed by Dr. Curt since 2019.  X-rays in 2021 showed new erosions and radiographic progression.  High risk medication use  Acute pain of both hips  Lumbar spondylosis  Orders: No orders of the defined types were placed in this encounter.  No orders of the defined types were placed in this encounter.   Face-to-face time spent with patient was *** minutes. Greater than 50% of time was spent in counseling and coordination of care.  Follow-Up Instructions: No follow-ups on file.   Maya Nash, MD  Note - This record has been created using Animal nutritionist.  Chart creation errors have been sought, but may not always  have been located. Such creation errors do not reflect on  the standard of medical care.    [1]  Social History Tobacco Use   Smoking status: Never   Smokeless tobacco: Never  Vaping Use   Vaping status: Never Used  Substance Use Topics   Alcohol use: Not Currently   Drug use: Never   "

## 2025-01-21 ENCOUNTER — Encounter: Admitting: Rheumatology

## 2025-01-21 DIAGNOSIS — M1712 Unilateral primary osteoarthritis, left knee: Secondary | ICD-10-CM

## 2025-01-21 DIAGNOSIS — M47816 Spondylosis without myelopathy or radiculopathy, lumbar region: Secondary | ICD-10-CM

## 2025-01-21 DIAGNOSIS — M0600 Rheumatoid arthritis without rheumatoid factor, unspecified site: Secondary | ICD-10-CM

## 2025-01-21 DIAGNOSIS — Z8261 Family history of arthritis: Secondary | ICD-10-CM

## 2025-01-21 DIAGNOSIS — Z8669 Personal history of other diseases of the nervous system and sense organs: Secondary | ICD-10-CM

## 2025-01-21 DIAGNOSIS — Z79899 Other long term (current) drug therapy: Secondary | ICD-10-CM

## 2025-01-21 DIAGNOSIS — M810 Age-related osteoporosis without current pathological fracture: Secondary | ICD-10-CM

## 2025-01-21 DIAGNOSIS — Z9109 Other allergy status, other than to drugs and biological substances: Secondary | ICD-10-CM

## 2025-01-21 DIAGNOSIS — M25551 Pain in right hip: Secondary | ICD-10-CM

## 2025-01-21 DIAGNOSIS — Z96651 Presence of right artificial knee joint: Secondary | ICD-10-CM

## 2025-01-21 DIAGNOSIS — R7689 Other specified abnormal immunological findings in serum: Secondary | ICD-10-CM

## 2025-01-21 DIAGNOSIS — R42 Dizziness and giddiness: Secondary | ICD-10-CM

## 2025-02-18 ENCOUNTER — Ambulatory Visit: Admitting: Rheumatology
